# Patient Record
Sex: Female | Born: 1995 | Race: White | Hispanic: No | Marital: Single | Smoking: Former smoker
Health system: Southern US, Community
[De-identification: ages and names within clinical notes are randomized; demographics above are authoritative.]

## PROBLEM LIST (undated history)

## (undated) DIAGNOSIS — N926 Irregular menstruation, unspecified: Secondary | ICD-10-CM

## (undated) DIAGNOSIS — O039 Complete or unspecified spontaneous abortion without complication: Secondary | ICD-10-CM

## (undated) DIAGNOSIS — R519 Headache, unspecified: Secondary | ICD-10-CM

## (undated) DIAGNOSIS — Z8619 Personal history of other infectious and parasitic diseases: Secondary | ICD-10-CM

## (undated) DIAGNOSIS — R51 Headache: Secondary | ICD-10-CM

## (undated) HISTORY — DX: Personal history of other infectious and parasitic diseases: Z86.19

## (undated) HISTORY — DX: Irregular menstruation, unspecified: N92.6

## (undated) HISTORY — DX: Headache: R51

## (undated) HISTORY — PX: WISDOM TOOTH EXTRACTION: SHX21

## (undated) HISTORY — DX: Headache, unspecified: R51.9

## (undated) HISTORY — DX: Complete or unspecified spontaneous abortion without complication: O03.9

---
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
## 2014-07-31 DIAGNOSIS — R109 Unspecified abdominal pain: Secondary | ICD-10-CM | POA: Insufficient documentation

## 2014-07-31 DIAGNOSIS — K529 Noninfective gastroenteritis and colitis, unspecified: Secondary | ICD-10-CM | POA: Insufficient documentation

## 2014-07-31 DIAGNOSIS — R630 Anorexia: Secondary | ICD-10-CM | POA: Insufficient documentation

## 2014-08-01 DIAGNOSIS — N921 Excessive and frequent menstruation with irregular cycle: Secondary | ICD-10-CM | POA: Insufficient documentation

## 2014-09-14 IMAGING — US US OB < 14 WEEKS - US OB TV
1 series · 13 of 28 positions shown · non-contrast
Comparison: none

REASON FOR EXAM: threatened abortion; bleeding mild pelvic discomfort
COMMENTS:

[Series 1: us ob < 14 weeks - us ob tv · 0.20mm/px · 13 of 105 slices shown]
[im 4/105]
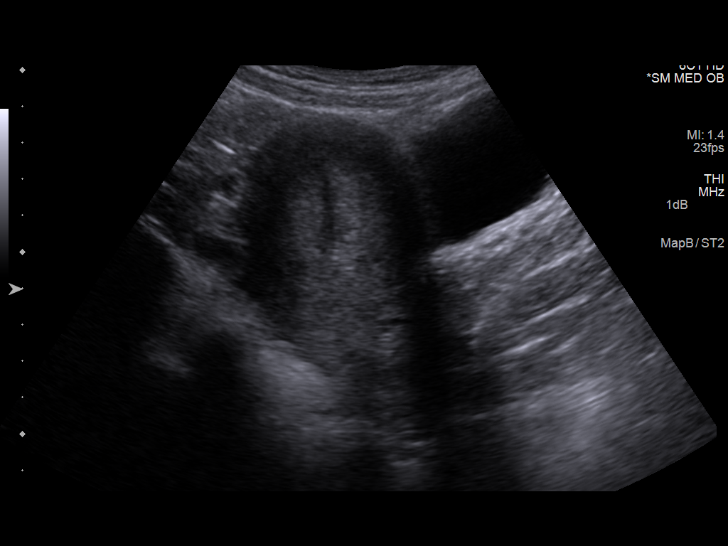
[im 12/105]
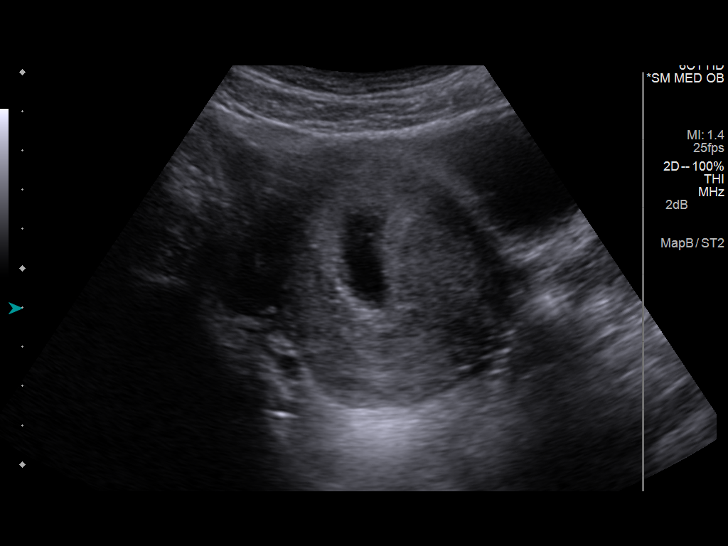
[im 20/105]
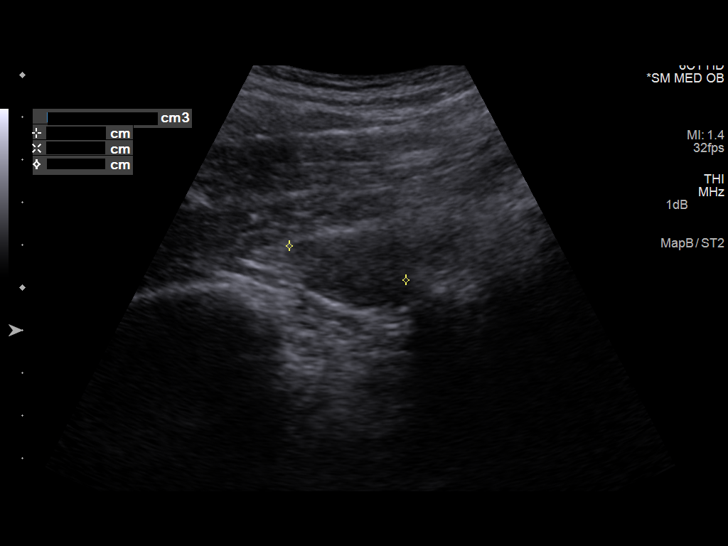
[im 27/105]
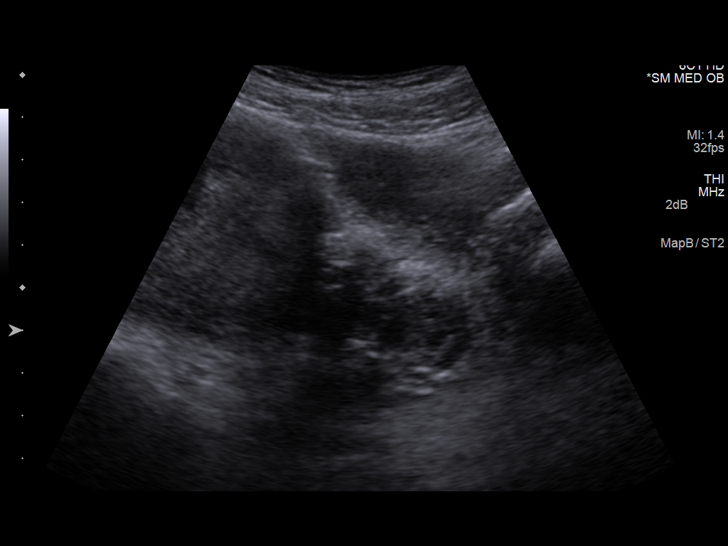
[im 35/105]
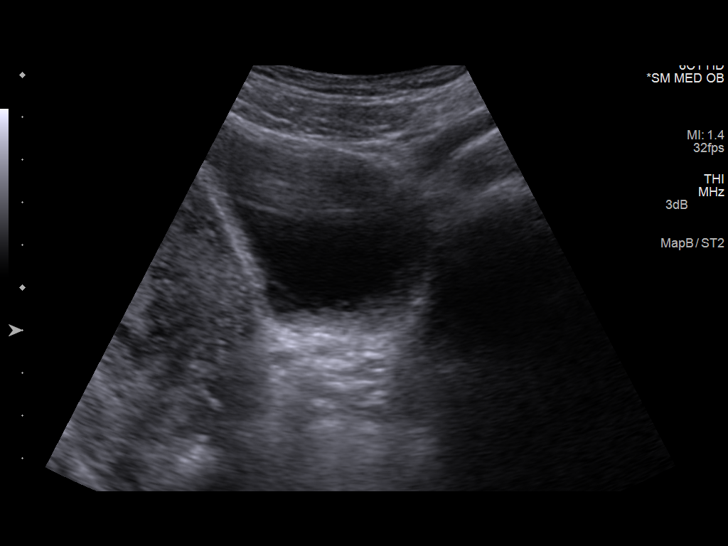
[im 43/105]
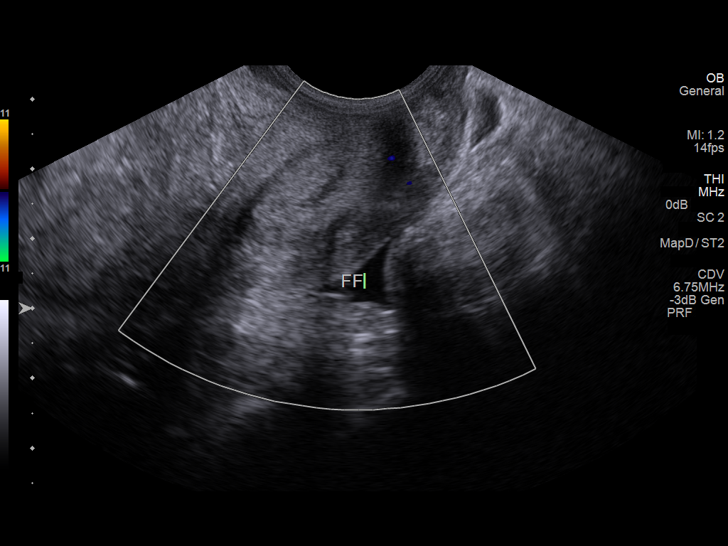
[im 54/105]
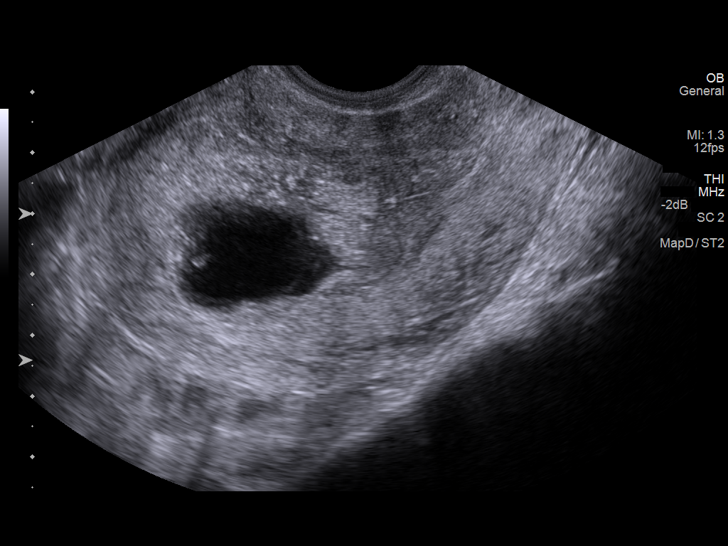
[im 62/105]
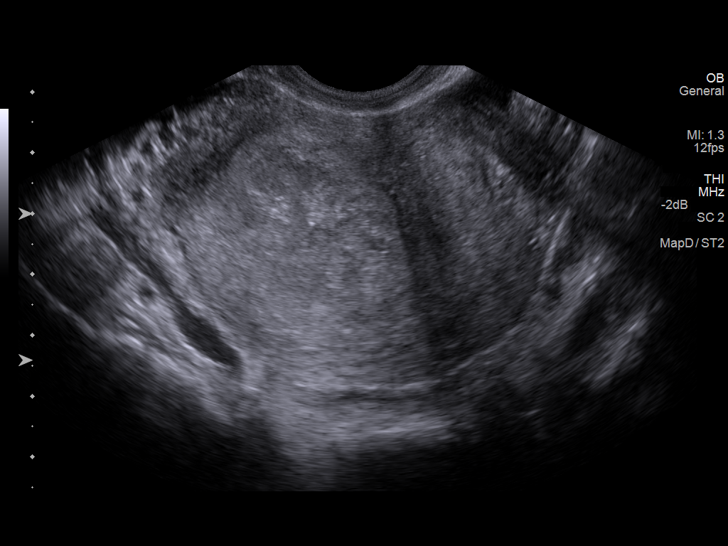
[im 70/105]
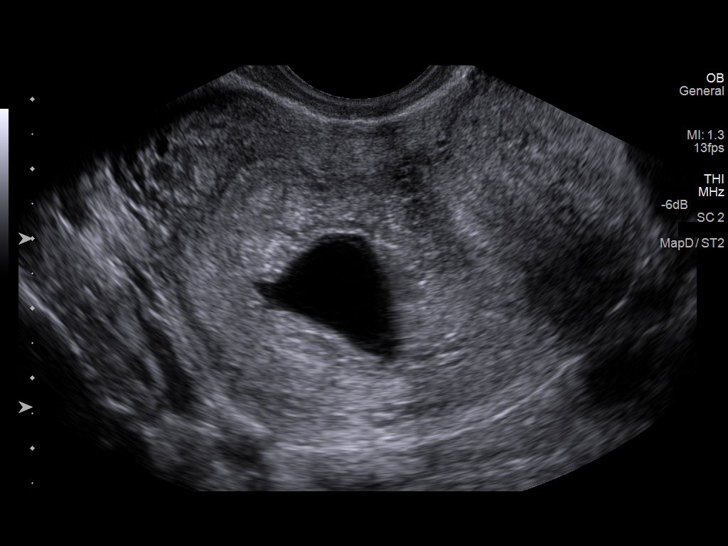
[im 78/105]
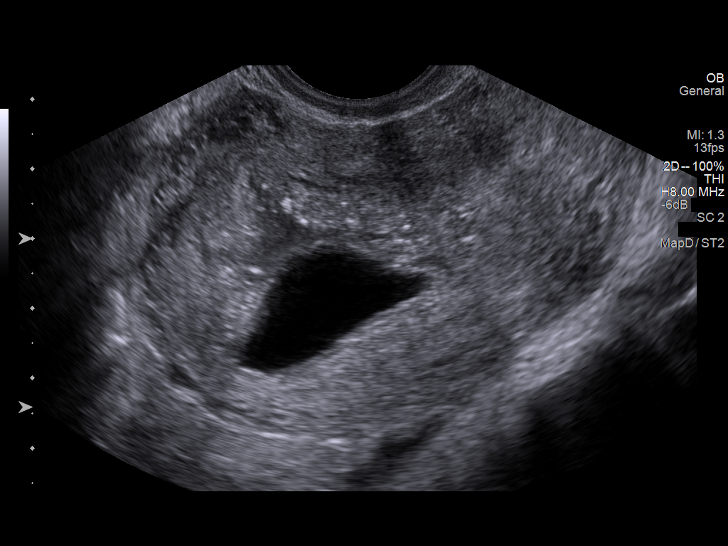
[im 85/105]
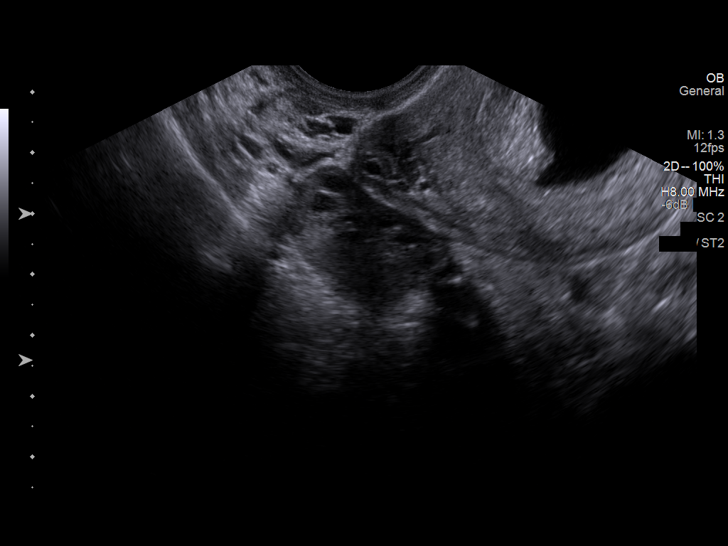
[im 93/105]
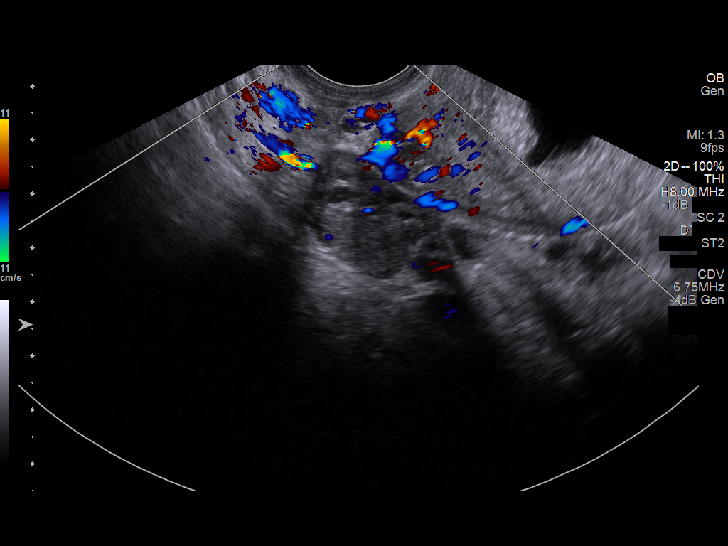
[im 101/105]
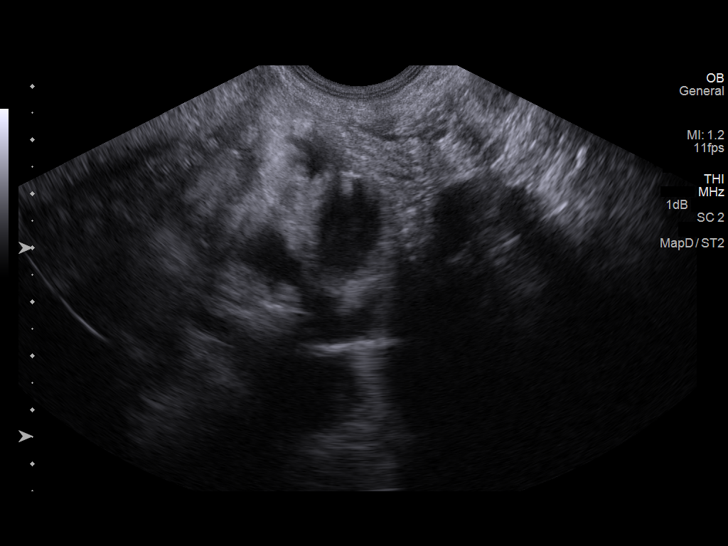

[13 of 28 positions shown; findings below may reference images not displayed]

PROCEDURE:     US  - US OB LESS THAN 14 WEEKS/W TRANS  - August 06, 2013  [DATE]

RESULT:     Transabdominal and transvaginal imaging was performed through
the pelvis.

There is a fluid-filled sac measuring 3.46 cm in the endometrial cavity.
This would correspond to an 8 week 5 day gestation. However, no yolk sac or
fetal pole is demonstrated. There are no findings suspicious for a
subchorionic hemorrhage. There is a small amount of fluid in the cervix.

The ovaries are normal in echotexture and contour. The right ovary measures
2.4 x 2.7 x 2.9 centimeters. The left ovary measures 2 x 1.3 x
centimeters.
IMPRESSION: 1. There is a fluid-filled sac in the endometrial cavity which could reflect
a gestational sac but there is no fetal pole or yolk sac demonstrated. If
this were to reflect a gestational sac it would correspond to an 8 week 5
day gestation. This may reflect a blighted ovum or an embryonic pregnancy or
pseudogestational sac.
2. There is no evidence of an ectopic pregnancy nor other adnexal
abnormality.

Continued followup of beta-hCG levels is recommended. Serial followup
ultrasound examinations are available upon reque[REDACTED]

## 2014-11-10 DIAGNOSIS — J312 Chronic pharyngitis: Secondary | ICD-10-CM | POA: Insufficient documentation

## 2014-12-06 ENCOUNTER — Emergency Department: Payer: Self-pay | Admitting: Emergency Medicine

## 2014-12-06 LAB — CBC WITH DIFFERENTIAL/PLATELET
BASOS PCT: 1.3 %
Basophil #: 0.1 10*3/uL (ref 0.0–0.1)
EOS PCT: 4.5 %
Eosinophil #: 0.3 10*3/uL (ref 0.0–0.7)
HCT: 43 % (ref 35.0–47.0)
HGB: 14.1 g/dL (ref 12.0–16.0)
Lymphocyte #: 2.2 10*3/uL (ref 1.0–3.6)
Lymphocyte %: 32.6 %
MCH: 31 pg (ref 26.0–34.0)
MCHC: 32.8 g/dL (ref 32.0–36.0)
MCV: 95 fL (ref 80–100)
MONO ABS: 0.5 x10 3/mm (ref 0.2–0.9)
Monocyte %: 7.6 %
NEUTROS PCT: 54 %
Neutrophil #: 3.7 10*3/uL (ref 1.4–6.5)
Platelet: 234 10*3/uL (ref 150–440)
RBC: 4.54 10*6/uL (ref 3.80–5.20)
RDW: 13.2 % (ref 11.5–14.5)
WBC: 6.8 10*3/uL (ref 3.6–11.0)

## 2014-12-06 LAB — COMPREHENSIVE METABOLIC PANEL
ALK PHOS: 70 U/L (ref 46–116)
ANION GAP: 11 (ref 7–16)
Albumin: 4 g/dL (ref 3.4–5.0)
BILIRUBIN TOTAL: 0.3 mg/dL (ref 0.2–1.0)
BUN: 10 mg/dL (ref 7–18)
Calcium, Total: 8.4 mg/dL — ABNORMAL LOW (ref 8.5–10.1)
Chloride: 109 mmol/L — ABNORMAL HIGH (ref 98–107)
Co2: 23 mmol/L (ref 21–32)
Creatinine: 0.67 mg/dL (ref 0.60–1.30)
EGFR (African American): >60
Glucose: 90 mg/dL (ref 65–99)
OSMOLALITY: 284 (ref 275–301)
Potassium: 3.6 mmol/L (ref 3.5–5.1)
SGOT(AST): 20 U/L (ref 15–37)
SGPT (ALT): 19 U/L (ref 14–63)
SODIUM: 143 mmol/L (ref 136–145)
Total Protein: 7.4 g/dL (ref 6.4–8.2)

## 2014-12-06 LAB — ETHANOL: Ethanol: 246 mg/dL

## 2014-12-06 LAB — LIPASE, BLOOD: Lipase: 118 U/L (ref 73–393)

## 2014-12-06 LAB — TROPONIN I

## 2014-12-07 LAB — URINALYSIS, COMPLETE
BILIRUBIN, UR: NEGATIVE
BLOOD: NEGATIVE
Bacteria: NONE SEEN
Glucose,UR: NEGATIVE mg/dL (ref 0–75)
Ketone: NEGATIVE
Leukocyte Esterase: NEGATIVE
Nitrite: NEGATIVE
Ph: 7 (ref 4.5–8.0)
Protein: NEGATIVE
RBC,UR: <1 /HPF (ref 0–5)
Specific Gravity: 1.002 (ref 1.003–1.030)
WBC UR: NONE SEEN /HPF (ref 0–5)

## 2016-06-03 HISTORY — PX: HAND SURGERY: SHX662

## 2016-08-22 ENCOUNTER — Encounter: Payer: Self-pay | Admitting: Family Medicine

## 2016-08-22 ENCOUNTER — Ambulatory Visit (INDEPENDENT_AMBULATORY_CARE_PROVIDER_SITE_OTHER): Payer: 59 | Admitting: Family Medicine

## 2016-08-22 VITALS — BP 116/66 | HR 92 | Temp 98.4°F | Wt 117.5 lb

## 2016-08-22 DIAGNOSIS — G4700 Insomnia, unspecified: Secondary | ICD-10-CM

## 2016-08-22 DIAGNOSIS — J069 Acute upper respiratory infection, unspecified: Secondary | ICD-10-CM | POA: Diagnosis not present

## 2016-08-22 MED ORDER — AZITHROMYCIN 250 MG PO TABS: ORAL_TABLET | ORAL | 0 refills | Status: DC

## 2016-08-22 MED ORDER — BENZONATATE 100 MG PO CAPS: 100.0000 mg | ORAL_CAPSULE | Freq: Three times a day (TID) | ORAL | 0 refills | Status: DC | PRN

## 2016-08-22 NOTE — Progress Notes (Signed)
Subjective:    Patient ID: Joyce Montoya, female    DOB: 09-28-1996, 20 y.o.   MRN: [id number]  HPI This is a 20 yo female accompanied by her mother, who presents today with cough, aches and headaches. She reports symptoms for at least a month. Was seen 8 days ago and was prescribed prednisone taper and albuterol inhaler. Felt better the first couple of days then resumed cough (productive of green/yellow sputum) and runny nose. Last night felt SOB and had wheezing. Hasn't used inhaler recently. No meds for cough recently. Has tired mucinex dm x 1 and it made her feel worse. Throat feels swollen, not sore except in the morning. Doesn't think she has had a fever.   Mother reports that Joyce Montoya has bee complaining of fatigue and feeling unwell for last 6 months. She has not been sleeping well- wakes every 1-1.5 hours through the night. Not sure why she awakens, denies pain, nocturia. She does not drink caffeine daily. Naps occasionally. Has difficulty staying awake during the day. No problems with sleep in the past. She lives with her boyfriend and his 66 yo daughter. 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 She reports her relationship is, "ok, sometimes good, sometimes bad, mostly good." Not currently sexually active. Uses condoms for birth control. She lives in a cabin and has recently found mold in the bathroom, wonders if symptoms related to exposure. Has intermittent diarrhea and boyfriend has diarrhea all the time. She does not currently have diarrhea.   She is taking daily metronidazole daily for recurrent BV Per gyn.   No past medical history on file. No past surgical history on file.  No family history on file. Social History  Substance Use Topics  . Smoking status: Current Every Day Smoker    Packs/day: 0.25  . Smokeless tobacco: Never Used  . Alcohol use Yes     Comment: social      Review of Systems Per HPI    Objective:   Physical Exam    Constitutional: She is oriented to person, place, and time. She appears well-developed and well-nourished. No distress.  HENT:  Head: Normocephalic and atraumatic.  Right Ear: Tympanic membrane, external ear and ear canal normal.  Left Ear: Tympanic membrane, external ear and ear canal normal.  Nose: Nose normal.  Mouth/Throat: Uvula is midline. No oropharyngeal exudate, posterior oropharyngeal edema or posterior oropharyngeal erythema.  Post nasal drainage.   Eyes: Conjunctivae are normal.  Neurological: She is alert and oriented to person, place, and time.  Skin: Skin is warm. She is not diaphoretic.  Psychiatric: She has a normal mood and affect. Her behavior is normal. Judgment and thought content normal.  Vitals reviewed.    BP 116/66   Pulse 92   Temp 98.4 F (36.9 C) (Oral)   Wt 117 lb 8 oz (53.3 kg)   LMP 08/07/2016   SpO2 98%   Assessment & Plan:  1. Upper respiratory tract infection, unspecified type - given prolonged course and recurrence of symptoms while on prednisone, will treat with antibiotic - azithromycin (ZITHROMAX) 250 MG tablet; Take 2 tablets today then 1 a day until finished  Dispense: 6 tablet; Refill: 0 - benzonatate (TESSALON) 100 MG capsule; Take 1-2 capsules (100-200 mg total) by mouth 3 (three) times daily as needed for cough.  Dispense: 40 capsule; Refill: 0 - encouraged smoking cessation - rest, use inhaler every 2-4 hours as needed for cough and wheeze  2. Insomnia, unspecified  type - new onset without obvious cause, unsure if stress playing role - Provided written and verbal information regarding diagnosis and treatment. - encouraged her to try melatonin nightly for at least two nights, discussed good sleep hygiene  She has an upcoming appointment with Joyce Montoya to establish care and can further discuss symptoms.   Joyce Reamer, FNP-BC  [facility]  08/22/2016 11:53 AM

## 2016-08-22 NOTE — Patient Instructions (Signed)
For nasal congestion you can use Afrin nasal spray for 3 days max, Sudafed, saline nasal spray (generic is fine for all). For cough you can try Delsym. Drink enough fluids to make your urine light yellow. For fever/chill/muscle aches you can take over the counter acetaminophen or ibuprofen.  Please come back in if you are not better in 5-7 days or if you develop wheezing, shortness of breath or persistent vomiting.   Insomnia Insomnia is a sleep disorder that makes it difficult to fall asleep or to stay asleep. Insomnia can cause tiredness (fatigue), low energy, difficulty concentrating, mood swings, and poor performance at work or school.  There are three different ways to classify insomnia:  Difficulty falling asleep.  Difficulty staying asleep.  Waking up too early in the morning. Any type of insomnia can be long-term (chronic) or short-term (acute). Both are common. Short-term insomnia usually lasts for three months or less. Chronic insomnia occurs at least three times a week for longer than three months. CAUSES  Insomnia may be caused by another condition, situation, or substance, such as:  Anxiety.  Certain medicines.  Gastroesophageal reflux disease (GERD) or other gastrointestinal conditions.  Asthma or other breathing conditions.  Restless legs syndrome, sleep apnea, or other sleep disorders.  Chronic pain.  Menopause. This may include hot flashes.  Stroke.  Abuse of alcohol, tobacco, or illegal drugs.  Depression.  Caffeine.   Neurological disorders, such as Alzheimer disease.  An overactive thyroid (hyperthyroidism). The cause of insomnia may not be known. RISK FACTORS Risk factors for insomnia include:  Gender. Women are more commonly affected than men.  Age. Insomnia is more common as you get older.  Stress. This may involve your professional or personal life.  Income. Insomnia is more common in people with lower income.  Lack of exercise.    Irregular work schedule or night shifts.  Traveling between different time zones. SIGNS AND SYMPTOMS If you have insomnia, trouble falling asleep or trouble staying asleep is the main symptom. This may lead to other symptoms, such as:  Feeling fatigued.  Feeling nervous about going to sleep.  Not feeling rested in the morning.  Having trouble concentrating.  Feeling irritable, anxious, or depressed. TREATMENT  Treatment for insomnia depends on the cause. If your insomnia is caused by an underlying condition, treatment will focus on addressing the condition. Treatment may also include:   Medicines to help you sleep.  Counseling or therapy.  Lifestyle adjustments. HOME CARE INSTRUCTIONS   Take medicines only as directed by your health care provider.  Keep regular sleeping and waking hours. Avoid naps.  Keep a sleep diary to help you and your health care provider figure out what could be causing your insomnia. Include:   When you sleep.  When you wake up during the night.  How well you sleep.   How rested you feel the next day.  Any side effects of medicines you are taking.  What you eat and drink.   Make your bedroom a comfortable place where it is easy to fall asleep:  Put up shades or special blackout curtains to block light from outside.  Use a white noise machine to block noise.  Keep the temperature cool.   Exercise regularly as directed by your health care provider. Avoid exercising right before bedtime.  Use relaxation techniques to manage stress. Ask your health care provider to suggest some techniques that may work well for you. These may include:  Breathing exercises.  Routines to  release muscle tension.  Visualizing peaceful scenes.  Cut back on alcohol, caffeinated beverages, and cigarettes, especially close to bedtime. These can disrupt your sleep.  Do not overeat or eat spicy foods right before bedtime. This can lead to digestive  discomfort that can make it hard for you to sleep.  Limit screen use before bedtime. This includes:  Watching TV.  Using your smartphone, tablet, and computer.  Stick to a routine. This can help you fall asleep faster. Try to do a quiet activity, brush your teeth, and go to bed at the same time each night.  Get out of bed if you are still awake after 15 minutes of trying to sleep. Keep the lights down, but try reading or doing a quiet activity. When you feel sleepy, go back to bed.  Make sure that you drive carefully. Avoid driving if you feel very sleepy.  Keep all follow-up appointments as directed by your health care provider. This is important. SEEK MEDICAL CARE IF:   You are tired throughout the day or have trouble in your daily routine due to sleepiness.  You continue to have sleep problems or your sleep problems get worse. SEEK IMMEDIATE MEDICAL CARE IF:   You have serious thoughts about hurting yourself or someone else.   This information is not intended to replace advice given to you by your health care provider. Make sure you discuss any questions you have with your health care provider.   Document Released: 10/17/2000 Document Revised: 07/11/2015 Document Reviewed: 07/21/2014 Elsevier Interactive Patient Education [employer].

## 2016-09-02 ENCOUNTER — Encounter: Payer: Self-pay | Admitting: Primary Care

## 2016-09-02 ENCOUNTER — Ambulatory Visit (INDEPENDENT_AMBULATORY_CARE_PROVIDER_SITE_OTHER): Payer: 59 | Admitting: Primary Care

## 2016-09-02 VITALS — BP 104/60 | HR 87 | Temp 98.3°F | Ht 61.0 in | Wt 115.4 lb

## 2016-09-02 DIAGNOSIS — R519 Headache, unspecified: Secondary | ICD-10-CM

## 2016-09-02 DIAGNOSIS — R51 Headache: Secondary | ICD-10-CM | POA: Diagnosis not present

## 2016-09-02 DIAGNOSIS — R5383 Other fatigue: Secondary | ICD-10-CM | POA: Diagnosis not present

## 2016-09-02 DIAGNOSIS — J302 Other seasonal allergic rhinitis: Secondary | ICD-10-CM | POA: Diagnosis not present

## 2016-09-02 LAB — COMPREHENSIVE METABOLIC PANEL
ALT: 19 U/L (ref 0–35)
AST: 21 U/L (ref 0–37)
Albumin: 4.1 g/dL (ref 3.5–5.2)
Alkaline Phosphatase: 44 U/L (ref 39–117)
BILIRUBIN TOTAL: 0.8 mg/dL (ref 0.2–1.2)
BUN: 14 mg/dL (ref 6–23)
CHLORIDE: 104 meq/L (ref 96–112)
CO2: 26 mEq/L (ref 19–32)
CREATININE: 0.65 mg/dL (ref 0.40–1.20)
Calcium: 9.3 mg/dL (ref 8.4–10.5)
GFR: 121.35 mL/min (ref >60.00)
Glucose, Bld: 82 mg/dL (ref 70–99)
Potassium: 4 mEq/L (ref 3.5–5.1)
SODIUM: 137 meq/L (ref 135–145)
TOTAL PROTEIN: 6.4 g/dL (ref 6.0–8.3)

## 2016-09-02 LAB — IBC PANEL
Iron: 210 ug/dL — ABNORMAL HIGH (ref 42–145)
SATURATION RATIOS: 74.3 % — AB (ref 20.0–50.0)
Transferrin: 202 mg/dL — ABNORMAL LOW (ref 212.0–360.0)

## 2016-09-02 LAB — CBC
HCT: 40.3 % (ref 36.0–46.0)
HEMOGLOBIN: 13.8 g/dL (ref 12.0–15.0)
MCHC: 34.2 g/dL (ref 30.0–36.0)
MCV: 95.3 fl (ref 78.0–100.0)
PLATELETS: 204 10*3/uL (ref 150.0–400.0)
RBC: 4.23 Mil/uL (ref 3.87–5.11)
RDW: 12.4 % (ref 11.5–15.5)
WBC: 5.1 10*3/uL (ref 4.0–10.5)

## 2016-09-02 LAB — TSH: TSH: 1.05 u[IU]/mL (ref 0.35–4.50)

## 2016-09-02 NOTE — Assessment & Plan Note (Signed)
Chronic, infrequent Claritin use.  Suspect recent symptoms of shortness of breath related to allergy to poor living conditions. Suspect this will improve with new living quarters. Recommend daily Claritin use, continue albuterol PRN. Will consider Spirometry next visit if symptoms persist. Exam unremarkable.

## 2016-09-02 NOTE — Progress Notes (Signed)
Subjective:    Patient ID: Joyce Montoya, female    DOB: [birth year], 20 y.o.   MRN: [id number]  HPI  Joyce Montoya is a 20 year old female who presents today to establish care and discuss the problems mentioned below. Will obtain old records.  1) Shortness of Breath: Also with wheezing and chest tightness for the past several years. She will also experience symptoms of sneezing, itchy/watery eyes. Her symptoms typically occur during the evening 2-3 times weekly. She has been using her boyfriends albuterol inhaler intermittently for the past 3 months with immediate improvement. She has no prior diagnosis of asthma but has suffered from seasonal allergies for most of her life.   Her symptoms of shortness of breath do not bother her during the day. She lives in a non insulated cabin that is very dusty and recently found mold growing in the bathroom. She moved out of her cabin 2 weeks ago. She does take Claritin as needed, but not on a regular basis. She has a strong family history of allergic rhinitis.  2) Fatigue: Feeling tired during the day, everyday even after a full nights sleep. She has struggled with this for the last 6 months. She will wake during the night to use the bathroom or get something to drink and is able to fall back asleep in 5-10 minutes. She does not have difficulty falling asleep. She denies swelling to her throat, cold intolerance, palpitations.  3) Headaches: Wakes up with headaches 3 times weekly on average. Her headaches are located to her frontal lobes bilaterally. Her headaches will sometimes last all day, sometimes improve by mid afternoon. She will occasionally take Advil or Tylenol, sometimes nothing. She will experience photophobia, phonophobia during headaches. She describes her pain as pressure, throbbing. Both her mother and sister have frequent headaches. She's never been on preventative treatment for her headaches.  Review of Systems  Constitutional: Positive for  fatigue. Negative for diaphoresis and fever.  HENT: Negative for congestion.   Respiratory: Positive for shortness of breath and wheezing. Negative for cough.   Cardiovascular: Negative for palpitations.  Endocrine: Negative for cold intolerance.  Neurological: Positive for headaches.  Psychiatric/Behavioral: Negative for sleep disturbance. The patient is not nervous/anxious.        Past Medical History:  Diagnosis Date  . Frequent headaches      Social History   Social History  . Marital status: Single    Spouse name: N/A  . Number of children: N/A  . Years of education: N/A   Occupational History  . Not on file.   Social History Main Topics  . Smoking status: Current Every Day Smoker    Packs/day: 0.25  . Smokeless tobacco: Never Used  . Alcohol use Yes     Comment: social  . Drug use: Unknown  . Sexual activity: Not on file   Other Topics Concern  . Not on file   Social History Narrative   Single.   Works for [employer].   Enjoys riding her horses.     Past Surgical History:  Procedure Laterality Date  . HAND SURGERY Right 06/2016    Family History  Problem Relation Age of Onset  . Headache Mother   . Ovarian cancer Mother   . Headache Sister     No Known Allergies  No current outpatient prescriptions on file prior to visit.   No current facility-administered medications on file prior to visit.     BP 104/60  Pulse 87   Temp 98.3 F (36.8 C) (Oral)   Ht 5\' 1"  (1.549 m)   Wt 115 lb 6.4 oz (52.3 kg)   LMP 08/07/2016   SpO2 97%   BMI 21.80 kg/m    Objective:   Physical Exam  Constitutional: She appears well-nourished.  Neck: Neck supple. No thyromegaly present.  Cardiovascular: Normal rate and regular rhythm.   Pulmonary/Chest: Effort normal and breath sounds normal.  Skin: Skin is warm and dry.  Psychiatric: She has a normal mood and affect.          Assessment & Plan:

## 2016-09-02 NOTE — Progress Notes (Signed)
Pre visit review using our clinic review tool, if applicable. No additional management support is needed unless otherwise documented below in the visit note. 

## 2016-09-02 NOTE — Assessment & Plan Note (Signed)
Occur three times weekly some weeks, some weeks without headaches. Discussed ibuprofen use and to notify if headaches become more frequent. Exam unremarkable.

## 2016-09-02 NOTE — Assessment & Plan Note (Signed)
Present x 6 months. Exam today unremarkable. Doesn't seem to be related to sleep. Could be stress given that she works often. Will check TSH, CBC, CMP to rule out any metabolic cause.

## 2016-09-02 NOTE — Patient Instructions (Signed)
Start taking your Claritin everyday for your allergy symptoms.  Use the Albuterol inhaler sparingly as needed for shortness of breath and chest tightness.  Complete lab work prior to leaving today. I will notify you of your results once received.   You may take Ibuprofen 600 mg three times daily as needed for headaches. Please notify me if you start to require this more than 4 times weekly on a consistent basis.  It was a pleasure to meet you today! Please don't hesitate to call me with any questions. Welcome to [facility]!

## 2016-10-22 DIAGNOSIS — Z1331 Encounter for screening for depression: Secondary | ICD-10-CM | POA: Insufficient documentation

## 2016-10-22 LAB — SICKLE CELL SCREEN: SICKLE CELL SCREEN: NORMAL

## 2016-10-22 LAB — OB RESULTS CONSOLE PLATELET COUNT: PLATELETS: 208 10*3/uL

## 2016-10-22 LAB — OB RESULTS CONSOLE RUBELLA ANTIBODY, IGM: RUBELLA: IMMUNE

## 2016-10-22 LAB — OB RESULTS CONSOLE GC/CHLAMYDIA
Chlamydia: NEGATIVE
Gonorrhea: NEGATIVE

## 2016-10-22 LAB — OB RESULTS CONSOLE RPR: RPR: NONREACTIVE

## 2016-10-22 LAB — OB RESULTS CONSOLE ANTIBODY SCREEN: ANTIBODY SCREEN: NEGATIVE

## 2016-10-22 LAB — OB RESULTS CONSOLE HEPATITIS B SURFACE ANTIGEN: HEP B S AG: NEGATIVE

## 2016-10-22 LAB — OB RESULTS CONSOLE ABO/RH: RH Type: POSITIVE

## 2016-10-22 LAB — OB RESULTS CONSOLE VARICELLA ZOSTER ANTIBODY, IGG: VARICELLA IGG: IMMUNE

## 2016-10-22 LAB — OB RESULTS CONSOLE HGB/HCT, BLOOD
HCT: 34 %
Hemoglobin: 12.1 g/dL

## 2016-10-22 LAB — HM PAP SMEAR

## 2016-10-22 LAB — OB RESULTS CONSOLE HIV ANTIBODY (ROUTINE TESTING): HIV: NONREACTIVE

## 2016-11-03 DIAGNOSIS — O039 Complete or unspecified spontaneous abortion without complication: Secondary | ICD-10-CM

## 2016-11-03 HISTORY — DX: Complete or unspecified spontaneous abortion without complication: O03.9

## 2017-01-29 DIAGNOSIS — Z1331 Encounter for screening for depression: Secondary | ICD-10-CM

## 2017-02-03 ENCOUNTER — Ambulatory Visit: Payer: Self-pay | Admitting: Advanced Practice Midwife

## 2017-05-19 ENCOUNTER — Ambulatory Visit (INDEPENDENT_AMBULATORY_CARE_PROVIDER_SITE_OTHER): Payer: 59 | Admitting: Obstetrics and Gynecology

## 2017-05-19 ENCOUNTER — Encounter: Payer: Self-pay | Admitting: Obstetrics and Gynecology

## 2017-05-19 VITALS — BP 102/68 | HR 85 | Ht 61.0 in | Wt 114.0 lb

## 2017-05-19 DIAGNOSIS — Z30011 Encounter for initial prescription of contraceptive pills: Secondary | ICD-10-CM | POA: Diagnosis not present

## 2017-05-19 DIAGNOSIS — N946 Dysmenorrhea, unspecified: Secondary | ICD-10-CM | POA: Diagnosis not present

## 2017-05-19 MED ORDER — NORETHIN ACE-ETH ESTRAD-FE 1-20 MG-MCG PO TABS: 1.0000 | ORAL_TABLET | Freq: Every day | ORAL | 1 refills | Status: DC

## 2017-05-19 NOTE — Progress Notes (Signed)
Chief Complaint  Patient presents with  . Contraception    discuss options    HPI:      [name] is a 21 y.o. G1P0010 who LMP was Patient's last menstrual period was 04/27/2016 (approximate)., presents today for [facility] consult. She would like to restart OCPs. She has done nexplanon in the past but had bleeding for 5 months. Menses are monthly off BC, lasting 7 days, with severe dysmen. NSAIDs not helpful, activities sometimes missed. No relief of dysmen with OCPs in the past but did have improvement of pain with nexplanon. She had constant bleeding with nexplanon however, so had it removed.  No hx of migraines with aura.   She is sex active, not using any BC/condoms. She is past due for her annual.      Patient Active Problem List   Diagnosis Date Noted  . Depression screening 10/22/2016  . Other fatigue 09/02/2016  . Frequent headaches 09/02/2016  . Seasonal allergies 09/02/2016  . Chronic sore throat 11/10/2014  . Menorrhagia with irregular cycle 08/01/2014  . Abdominal pain in female patient 07/31/2014  . Chronic diarrhea 07/31/2014  . Loss of appetite for more than 2 weeks 07/31/2014    Family History  Problem Relation Age of Onset  . Headache Mother   . Ovarian cancer Mother   . Headache Sister     Social History   Social History  . Marital status: Single    Spouse name: N/A  . Number of children: N/A  . Years of education: N/A   Occupational History  . Not on file.   Social History Main Topics  . Smoking status: Former Smoker    Packs/day: 0.25  . Smokeless tobacco: Never Used  . Alcohol use Yes     Comment: social  . Drug use: No  . Sexual activity: Yes    Partners: Male    Birth control/ protection: None   Other Topics Concern  . Not on file   Social History Narrative   Single.   Works for [employer].   Enjoys riding her horses.      Current Outpatient Prescriptions:  .  norethindrone-ethinyl estradiol ([name] 1/20)  1-20 MG-MCG tablet, Take 1 tablet by mouth daily., Disp: 28 tablet, Rfl: 1  Review of Systems  Constitutional: Negative for fever.  Gastrointestinal: Negative for blood in stool, constipation, diarrhea, nausea and vomiting.  Genitourinary: Negative for dyspareunia, dysuria, flank pain, frequency, hematuria, urgency, vaginal bleeding, vaginal discharge and vaginal pain.  Musculoskeletal: Negative for back pain.  Skin: Negative for rash.     OBJECTIVE:   Vitals:  BP 102/68 (BP Location: Left Arm, Patient Position: Sitting, Cuff Size: Normal)   Pulse 85   Ht 5\' 1"  (1.549 m)   Wt 114 lb (51.7 kg)   LMP 04/27/2016 (Approximate)   Breastfeeding? No   BMI 21.54 kg/m   Physical Exam  Constitutional: She is oriented to person, place, and time and well-developed, well-nourished, and in no distress.  Neurological: She is alert and oriented to person, place, and time.  Psychiatric: Memory, affect and judgment normal.  Vitals reviewed.    Assessment/Plan: Encounter for initial prescription of contraceptive pills - OCP start with next menses. Rx junel. Condoms. RTO in 2 months for annual.  Dysmenorrhea - Pt with severe sx on and off OCPs. Discussed depo if sx persist with OCP. Also discussed endometriosis and dx lap.     Meds ordered this encounter  Medications  .  norethindrone-ethinyl estradiol ([name] 1/20) 1-20 MG-MCG tablet    Sig: Take 1 tablet by mouth daily.    Dispense:  28 tablet    Refill:  1      Return in about 2 months (around 07/01/5620) for annual.  Alicia B. Copland, PA-C 05/19/2017 3:19 PM

## 2017-07-18 ENCOUNTER — Other Ambulatory Visit: Payer: Self-pay | Admitting: Obstetrics and Gynecology

## 2017-07-27 ENCOUNTER — Telehealth: Payer: Self-pay

## 2017-07-27 NOTE — Telephone Encounter (Signed)
Pt isn't due for annual until 10/22/17. The schedule isnt out yet

## 2017-07-27 NOTE — Telephone Encounter (Signed)
Pt calling for bc refill as she is out.  Pharm told her to contact us.  [phone]

## 2017-07-28 MED ORDER — NORETHIN ACE-ETH ESTRAD-FE 1-20 MG-MCG PO TABS: 1.0000 | ORAL_TABLET | Freq: Every day | ORAL | 2 refills | Status: DC

## 2017-07-28 NOTE — Telephone Encounter (Signed)
Pt aware by vm refills eRx'd.

## 2017-10-14 ENCOUNTER — Ambulatory Visit: Payer: 59 | Admitting: Family Medicine

## 2017-10-15 ENCOUNTER — Other Ambulatory Visit: Payer: Self-pay | Admitting: Obstetrics and Gynecology

## 2017-10-28 ENCOUNTER — Telehealth: Payer: Self-pay

## 2017-10-28 MED ORDER — NORETHIN ACE-ETH ESTRAD-FE 1-20 MG-MCG PO TABS: 1.0000 | ORAL_TABLET | Freq: Every day | ORAL | 0 refills | Status: DC

## 2017-10-28 NOTE — Telephone Encounter (Signed)
Please call pt and let her know refill eRx'd for one month.  She needs to sched annual exam.

## 2017-11-16 ENCOUNTER — Other Ambulatory Visit: Payer: Self-pay | Admitting: Obstetrics and Gynecology

## 2017-11-16 ENCOUNTER — Telehealth: Payer: Self-pay | Admitting: Obstetrics and Gynecology

## 2017-11-16 MED ORDER — NORETHIN ACE-ETH ESTRAD-FE 1-20 MG-MCG PO TABS: 1.0000 | ORAL_TABLET | Freq: Every day | ORAL | 0 refills | Status: DC

## 2017-11-16 NOTE — Telephone Encounter (Signed)
done

## 2017-11-16 NOTE — Telephone Encounter (Signed)
Pt is schedule January 12/03/16 at 3:00. Pt is requesting refill on Birthcontrol

## 2017-11-16 NOTE — Progress Notes (Signed)
Rx RF till annual 

## 2017-12-03 ENCOUNTER — Ambulatory Visit: Payer: 59 | Admitting: Obstetrics and Gynecology

## 2017-12-08 ENCOUNTER — Other Ambulatory Visit: Payer: Self-pay | Admitting: Obstetrics and Gynecology

## 2017-12-15 ENCOUNTER — Ambulatory Visit: Payer: 59 | Admitting: Obstetrics and Gynecology

## 2018-01-03 ENCOUNTER — Other Ambulatory Visit: Payer: Self-pay | Admitting: Obstetrics and Gynecology

## 2018-02-04 ENCOUNTER — Ambulatory Visit (INDEPENDENT_AMBULATORY_CARE_PROVIDER_SITE_OTHER): Payer: PRIVATE HEALTH INSURANCE | Admitting: Primary Care

## 2018-02-04 ENCOUNTER — Encounter: Payer: Self-pay | Admitting: Primary Care

## 2018-02-04 VITALS — BP 104/70 | HR 83 | Temp 98.3°F | Ht 61.0 in | Wt 116.8 lb

## 2018-02-04 DIAGNOSIS — L84 Corns and callosities: Secondary | ICD-10-CM

## 2018-02-04 DIAGNOSIS — D229 Melanocytic nevi, unspecified: Secondary | ICD-10-CM | POA: Diagnosis not present

## 2018-02-04 NOTE — Patient Instructions (Signed)
You will be contacted regarding your referral to Dermatology.  Please let us know if you have not been contacted within one week.   Wear sunscreen when exposed to the sun.  Try using toe socks or corn pads between your toes when wearing tight fitting shoes or when walking for prolonged periods of time.   It was a pleasure to see you today!   Corns and Calluses Corns are small areas of thickened skin that occur on the top, sides, or tip of a toe. They contain a cone-shaped core with a point that can press on a nerve below. This causes pain. Calluses are areas of thickened skin that can occur anywhere on the body including hands, fingers, palms, soles of the feet, and heels.Calluses are usually larger than corns. What are the causes? Corns and calluses are caused by rubbing (friction) or pressure, such as from shoes that are too tight or do not fit properly. What increases the risk? Corns are more likely to develop in people who have toe deformities, such as hammer toes. Since calluses can occur with friction to any area of the skin, calluses are more likely to develop in people who:  Work with their hands.  Wear shoes that fit poorly, shoes that are too tight, or shoes that are high-heeled.  Have toes deformities.  What are the signs or symptoms? Symptoms of a corn or callus include:  A hard growth on the skin.  Pain or tenderness under the skin.  Redness and swelling.  Increased discomfort while wearing tight-fitting shoes.  How is this diagnosed? Corns and calluses may be diagnosed with a medical history and physical exam. How is this treated? Corns and calluses may be treated with:  Removing the cause of the friction or pressure. This may include: ? Changing your shoes. ? Wearing shoe inserts (orthotics) or other protective layers in your shoes, such as a corn pad. ? Wearing gloves.  Medicines to help soften skin in the hardened, thickened areas.  Reducing the size of  the corn or callus by removing the dead layers of skin.  Antibiotic medicines to treat infection.  Surgery, if a toe deformity is the cause.  Follow these instructions at home:  Take medicines only as directed by your health care provider.  If you were prescribed an antibiotic, finish all of it even if you start to feel better.  Wear shoes that fit well. Avoid wearing high-heeled shoes and shoes that are too tight or too loose.  Wear any padding, protective layers, gloves, or orthotics as directed by your health care provider.  Soak your hands or feet and then use a file or pumice stone to soften your corn or callus. Do this as directed by your health care provider.  Check your corn or callus every day for signs of infection. Watch for: ? Redness, swelling, or pain. ? Fluid, blood, or pus. Contact a health care provider if:  Your symptoms do not improve with treatment.  You have increased redness, swelling, or pain at the site of your corn or callus.  You have fluid, blood, or pus coming from your corn or callus.  You have new symptoms. This information is not intended to replace advice given to you by your health care provider. Make sure you discuss any questions you have with your health care provider. Document Released: 07/26/2004 Document Revised: 05/09/2016 Document Reviewed: 10/16/2014 Elsevier Interactive Patient Education  [employer].

## 2018-02-04 NOTE — Progress Notes (Signed)
Subjective:    Patient ID: Joyce Montoya, female    DOB: 1996/08/29, 22 y.o.   MRN: [id number]  HPI  Joyce Montoya is a 22 year old female who presents today with multiple complaints.  1) Nevi: Located to the right lateral neck and right suprapubic region for years. She doesn't like them and would like them removed. She denies changes in size, shape, color.   2) Calluses: Located in between 4th and 5th toes on right foot, and between 4th and 5th toes on left foot. These have been intermittent for years, worse when wearing tight fitting shoes and when on her feet for prolonged periods of time.  3) Skin Spots: Located to the forehead that have been present for the past one month. 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  Review of Systems  Skin:       Nevi, skin spots, nevi       Past Medical History:  Diagnosis Date  . Frequent headaches   . History of chlamydia   . Irregular menses   . SAB (spontaneous abortion)   . SAB (spontaneous abortion) 11/2016     Social History   Socioeconomic History  . Marital status: Single    Spouse name: Not on file  . Number of children: Not on file  . Years of education: Not on file  . Highest education level: Not on file  Occupational History  . Not on file  Social Needs  . Financial resource strain: Not on file  . Food insecurity:    Worry: Not on file    Inability: Not on file  . Transportation needs:    Medical: Not on file    Non-medical: Not on file  Tobacco Use  . Smoking status: Former Smoker    Packs/day: 0.25  . Smokeless tobacco: Never Used  Substance and Sexual Activity  . Alcohol use: Yes    Comment: social  . Drug use: No  . Sexual activity: Yes    Partners: Male    Birth control/protection: None  Lifestyle  . Physical activity:    Days per week: Not on file    Minutes per session: Not on file  . Stress: Not on file  Relationships  . Social connections:    Talks  on phone: Not on file    Gets together: Not on file    Attends religious service: Not on file    Active member of club or organization: Not on file    Attends meetings of clubs or organizations: Not on file    Relationship status: Not on file  . Intimate partner violence:    Fear of current or ex partner: Not on file    Emotionally abused: Not on file    Physically abused: Not on file    Forced sexual activity: Not on file  Other Topics Concern  . Not on file  Social History Narrative   Single.   Works for [employer].   Enjoys riding her horses.     Past Surgical History:  Procedure Laterality Date  . HAND SURGERY Right 06/2016    Family History  Problem Relation Age of Onset  . Headache Mother   . Ovarian cancer Mother   . Headache Sister     No Known Allergies  Current Outpatient Medications on File Prior to Visit  Medication Sig Dispense Refill  . [name] 1/20 1-20 MG-MCG tablet TAKE 1  TABLET BY MOUTH EVERY DAY 28 tablet 4   No current facility-administered medications on file prior to visit.     BP 104/70   Pulse 83   Temp 98.3 F (36.8 C) (Oral)   Ht 5\' 1"  (1.549 m)   Wt 116 lb 12 oz (53 kg)   LMP 02/04/2018   SpO2 98%   BMI 22.06 kg/m    Objective:   Physical Exam  Skin: Skin is warm and dry. No erythema.     Callous to right foot, medial side of 5th toe and lateral side of 4th toe. Left foot with callous to medial side of 5th toe. Freckles to fore head. Nevus to right suprapubic region and right lateral neck.          Assessment & Plan:  Freckles:  Located to forehead from sun exposure. Mild sun damage. Discussed to always wear sunscreen when outdoors. Discussed treatment with medical spas vs dermatology evaluation.  Also discussed use of Vitamin E oil. Referral placed to dermatology.  Nevi:  Requesting removal of 2 nevi. Nevus located to suprapubic region very flat and would be difficult to remove in the office here. The  second nevus is located just over her carotid artery to right lateral neck, do not feel this is appropriate to remove in the office. Referral placed to dermatology for removal. Both Nevi appear benign.   Calloses:  Located to bilateral feet as mentioned above. Discussed use of corn pads/toe socks for protection against friction. Discussed to avoid tight fitting shoes, especially without socks. Also discussed podiatry evaluation if no improvement with conservative measures.   [name], NP

## 2018-05-13 ENCOUNTER — Other Ambulatory Visit: Payer: Self-pay

## 2018-05-13 ENCOUNTER — Encounter: Payer: Self-pay | Admitting: Emergency Medicine

## 2018-05-13 ENCOUNTER — Emergency Department
Admission: EM | Admit: 2018-05-13 | Discharge: 2018-05-13 | Disposition: A | Discharge status: Home / Self Care | Payer: No Typology Code available for payment source | Attending: Emergency Medicine | Admitting: Emergency Medicine

## 2018-05-13 DIAGNOSIS — T675XXA Heat exhaustion, unspecified, initial encounter: Secondary | ICD-10-CM | POA: Insufficient documentation

## 2018-05-13 DIAGNOSIS — R531 Weakness: Secondary | ICD-10-CM | POA: Insufficient documentation

## 2018-05-13 DIAGNOSIS — R200 Anesthesia of skin: Secondary | ICD-10-CM | POA: Insufficient documentation

## 2018-05-13 DIAGNOSIS — Z79899 Other long term (current) drug therapy: Secondary | ICD-10-CM | POA: Diagnosis not present

## 2018-05-13 DIAGNOSIS — E860 Dehydration: Secondary | ICD-10-CM | POA: Diagnosis not present

## 2018-05-13 DIAGNOSIS — Z87891 Personal history of nicotine dependence: Secondary | ICD-10-CM | POA: Insufficient documentation

## 2018-05-13 LAB — URINALYSIS, COMPLETE (UACMP) WITH MICROSCOPIC
BILIRUBIN URINE: NEGATIVE
Glucose, UA: NEGATIVE mg/dL
Hgb urine dipstick: NEGATIVE
Ketones, ur: NEGATIVE mg/dL
NITRITE: NEGATIVE
Protein, ur: NEGATIVE mg/dL
SPECIFIC GRAVITY, URINE: 1.012 (ref 1.005–1.030)
pH: 7 (ref 5.0–8.0)

## 2018-05-13 LAB — BASIC METABOLIC PANEL
ANION GAP: 8 (ref 5–15)
BUN: 11 mg/dL (ref 6–20)
CALCIUM: 9 mg/dL (ref 8.9–10.3)
CO2: 25 mmol/L (ref 22–32)
Chloride: 106 mmol/L (ref 98–111)
Creatinine, Ser: 0.58 mg/dL (ref 0.44–1.00)
GFR calc Af Amer: >60 mL/min (ref >60)
GLUCOSE: 133 mg/dL — AB (ref 70–99)
POTASSIUM: 3.9 mmol/L (ref 3.5–5.1)
SODIUM: 139 mmol/L (ref 135–145)

## 2018-05-13 LAB — HEPATIC FUNCTION PANEL
ALK PHOS: 51 U/L (ref 38–126)
ALT: 19 U/L (ref 0–44)
AST: 27 U/L (ref 15–41)
Albumin: 4.2 g/dL (ref 3.5–5.0)
BILIRUBIN TOTAL: 0.6 mg/dL (ref 0.3–1.2)
Bilirubin, Direct: <0.1 mg/dL (ref 0.0–0.2)
Total Protein: 7 g/dL (ref 6.5–8.1)

## 2018-05-13 LAB — CBC
HCT: 39.5 % (ref 35.0–47.0)
HEMOGLOBIN: 14.1 g/dL (ref 12.0–16.0)
MCH: 33.5 pg (ref 26.0–34.0)
MCHC: 35.7 g/dL (ref 32.0–36.0)
MCV: 93.7 fL (ref 80.0–100.0)
Platelets: 184 10*3/uL (ref 150–440)
RBC: 4.22 MIL/uL (ref 3.80–5.20)
RDW: 12.4 % (ref 11.5–14.5)
WBC: 3.8 10*3/uL (ref 3.6–11.0)

## 2018-05-13 LAB — CK: Total CK: 74 U/L (ref 38–234)

## 2018-05-13 LAB — POCT PREGNANCY, URINE: Preg Test, Ur: NEGATIVE

## 2018-05-13 LAB — HCG, QUANTITATIVE, PREGNANCY

## 2018-05-13 MED ORDER — SODIUM CHLORIDE 0.9 % IV BOLUS
1000.0000 mL | Freq: Once | INTRAVENOUS | Status: AC
  Administered 2018-05-13: 1000 mL via INTRAVENOUS

## 2018-05-13 MED ORDER — KETOROLAC TROMETHAMINE 30 MG/ML IJ SOLN
15.0000 mg | Freq: Once | INTRAMUSCULAR | Status: AC
  Administered 2018-05-13: 15 mg via INTRAVENOUS
  Filled 2018-05-13: qty 1

## 2018-05-13 MED ORDER — HALOPERIDOL LACTATE 5 MG/ML IJ SOLN
2.5000 mg | Freq: Once | INTRAMUSCULAR | Status: AC
  Administered 2018-05-13: 2.5 mg via INTRAVENOUS
  Filled 2018-05-13: qty 1

## 2018-05-13 NOTE — ED Triage Notes (Signed)
Pt presents to ED via POV with c/o generalized body aches that started at approx 1400 today. Pt states she started feeling "weak and funny" at approx 1200 today. Pt is noted to be tearful in triage. States intermittent sharp pains and intermittent tingling to her fingers of both hands.

## 2018-05-13 NOTE — Discharge Instructions (Signed)
It was a pleasure to take care of you today, and thank you for coming to our emergency department.  If you have any questions or concerns before leaving please ask the nurse to grab me and I'm more than happy to go through your aftercare instructions again.  If you were prescribed any opioid pain medication today such as Norco, Vicodin, Percocet, morphine, hydrocodone, or oxycodone please make sure you do not drive when you are taking this medication as it can alter your ability to drive safely.  If you have any concerns once you are home that you are not improving or are in fact getting worse before you can make it to your follow-up appointment, please do not hesitate to call 911 and come back for further evaluation.  Darel Hong, MD  Results for orders placed or performed during the hospital encounter of 27/51/70  Basic metabolic panel  Result Value Ref Range   Sodium 139 135 - 145 mmol/L   Potassium 3.9 3.5 - 5.1 mmol/L   Chloride 106 98 - 111 mmol/L   CO2 25 22 - 32 mmol/L   Glucose, Bld 133 (H) 70 - 99 mg/dL   BUN 11 6 - 20 mg/dL   Creatinine, Ser 0.58 0.44 - 1.00 mg/dL   Calcium 9.0 8.9 - 10.3 mg/dL   GFR calc non Af Amer >60 >60 mL/min   GFR calc Af Amer >60 >60 mL/min   Anion gap 8 5 - 15  CBC  Result Value Ref Range   WBC 3.8 3.6 - 11.0 K/uL   RBC 4.22 3.80 - 5.20 MIL/uL   Hemoglobin 14.1 12.0 - 16.0 g/dL   HCT 39.5 35.0 - 47.0 %   MCV 93.7 80.0 - 100.0 fL   MCH 33.5 26.0 - 34.0 pg   MCHC 35.7 32.0 - 36.0 g/dL   RDW 12.4 11.5 - 14.5 %   Platelets 184 150 - 440 K/uL  Urinalysis, Complete w Microscopic  Result Value Ref Range   Color, Urine YELLOW (A) YELLOW   APPearance CLEAR (A) CLEAR   Specific Gravity, Urine 1.012 1.005 - 1.030   pH 7.0 5.0 - 8.0   Glucose, UA NEGATIVE NEGATIVE mg/dL   Hgb urine dipstick NEGATIVE NEGATIVE   Bilirubin Urine NEGATIVE NEGATIVE   Ketones, ur NEGATIVE NEGATIVE mg/dL   Protein, ur NEGATIVE NEGATIVE mg/dL   Nitrite NEGATIVE NEGATIVE    Leukocytes, UA TRACE (A) NEGATIVE   RBC / HPF 0-5 0 - 5 RBC/hpf   WBC, UA 0-5 0 - 5 WBC/hpf   Bacteria, UA RARE (A) NONE SEEN   Squamous Epithelial / LPF 0-5 0 - 5  Hepatic function panel  Result Value Ref Range   Total Protein 7.0 6.5 - 8.1 g/dL   Albumin 4.2 3.5 - 5.0 g/dL   AST 27 15 - 41 U/L   ALT 19 0 - 44 U/L   Alkaline Phosphatase 51 38 - 126 U/L   Total Bilirubin 0.6 0.3 - 1.2 mg/dL   Bilirubin, Direct <0.1 0.0 - 0.2 mg/dL   Indirect Bilirubin NOT CALCULATED 0.3 - 0.9 mg/dL  hCG, quantitative, pregnancy  Result Value Ref Range   hCG, Beta Chain, Quant, S <1 <5 mIU/mL  CK  Result Value Ref Range   Total CK 74 38 - 234 U/L  Pregnancy, urine POC  Result Value Ref Range   Preg Test, Ur NEGATIVE NEGATIVE

## 2018-05-13 NOTE — ED Provider Notes (Signed)
[facility] Emergency Department Provider Note  ____________________________________________   First MD Initiated Contact with Patient 05/13/18 1857     (approximate)  I have reviewed the triage vital signs and the nursing notes.   HISTORY  Chief Complaint Weakness and Generalized Body Aches   HPI Joyce Montoya is a 22 y.o. female who comes to the emergency department with malaise generalized body aches and numbness and tingling in her fingers that began earlier today.  She was outside working in the hot sun when she began to hyperventilate and feel lightheaded and felt like she might pass out.  She did not pass out.  She had no chest pain or shortness of breath.  She has severe "tingly pain" in her entire body.  Nothing seems to make it better or worse.  This is never happened to her before.    Past Medical History:  Diagnosis Date  . Frequent headaches   . History of chlamydia   . Irregular menses   . SAB (spontaneous abortion)   . SAB (spontaneous abortion) 11/2016    Patient Active Problem List   Diagnosis Date Noted  . Depression screening 10/22/2016  . Other fatigue 09/02/2016  . Frequent headaches 09/02/2016  . Seasonal allergies 09/02/2016  . Chronic sore throat 11/10/2014  . Menorrhagia with irregular cycle 08/01/2014  . Abdominal pain in female patient 07/31/2014  . Chronic diarrhea 07/31/2014  . Loss of appetite for more than 2 weeks 07/31/2014    Past Surgical History:  Procedure Laterality Date  . HAND SURGERY Right 06/2016    Prior to Admission medications   Medication Sig Start Date End Date Taking? Authorizing Provider  [name] 1/20 1-20 MG-MCG tablet TAKE 1 TABLET BY MOUTH EVERY DAY 02/02/27  Yes Copland, Alicia B, PA-C    Allergies Patient has no known allergies.  Family History  Problem Relation Age of Onset  . Headache Mother   . Ovarian cancer Mother   . Headache Sister     Social History Social History    Tobacco Use  . Smoking status: Former Smoker    Packs/day: 0.25  . Smokeless tobacco: Never Used  Substance Use Topics  . Alcohol use: Yes    Comment: social  . Drug use: No    Review of Systems Constitutional: No fever/chills Eyes: No visual changes. ENT: No sore throat. Cardiovascular: Denies chest pain. Respiratory: Denies shortness of breath. Gastrointestinal: No abdominal pain.  Positive for nausea, no vomiting.  No diarrhea.  No constipation. Genitourinary: Negative for dysuria. Musculoskeletal: Negative for back pain. Skin: Negative for rash. Neurological: Negative for headaches, focal weakness or numbness.   ____________________________________________   PHYSICAL EXAM:  VITAL SIGNS: ED Triage Vitals [05/13/18 1844]  Enc Vitals Group     BP 115/70     Pulse Rate (!) 116     Resp 20     Temp 99.4 F (37.4 C)     Temp Source Oral     SpO2 99 %     Weight 120 lb (54.4 kg)     Height 5\' 1"  (1.549 m)     Head Circumference      Peak Flow      Pain Score 9     Pain Loc      Pain Edu?      Excl. in [place]?     Constitutional: Alert and oriented x4 tearful and anxious appearing Eyes: PERRL EOMI. Head: Atraumatic. Nose: No congestion/rhinnorhea. Mouth/Throat: No  trismus Neck: No stridor.   Cardiovascular: Tachycardic rate, regular rhythm. Grossly normal heart sounds.  Good peripheral circulation. Respiratory: Increased respiratory effort.  No retractions. Lungs CTAB and moving good air Gastrointestinal: Soft nontender Musculoskeletal: No lower extremity edema   Neurologic:  Normal speech and language. No gross focal neurologic deficits are appreciated. Skin:  Skin is warm, dry and intact. No rash noted. Psychiatric: Anxious appearing   ____________________________________________   DIFFERENTIAL includes but not limited to  Dehydration, heat exhaustion, rhabdomyolysis ____________________________________________   LABS (all labs ordered are listed,  but only abnormal results are displayed)  Labs Reviewed  BASIC METABOLIC PANEL - Abnormal; Notable for the following components:      Result Value   Glucose, Bld 133 (*)    All other components within normal limits  URINALYSIS, COMPLETE (UACMP) WITH MICROSCOPIC - Abnormal; Notable for the following components:   Color, Urine YELLOW (*)    APPearance CLEAR (*)    Leukocytes, UA TRACE (*)    Bacteria, UA RARE (*)    All other components within normal limits  CBC  HEPATIC FUNCTION PANEL  HCG, QUANTITATIVE, PREGNANCY  CK  POCT PREGNANCY, URINE    Lab work reviewed by me with no acute disease __________________________________________  EKG  ED ECG REPORT I, Darel Hong, the attending physician, personally viewed and interpreted this ECG.  Date: 05/15/2018 EKG Time:  Rate: 116 Rhythm: Tachycardia QRS Axis: Rightward axis which is old Intervals: normal ST/T Wave abnormalities: normal Narrative Interpretation: no evidence of acute ischemia  ____________________________________________  RADIOLOGY   ____________________________________________   PROCEDURES  Procedure(s) performed: no  Procedures  Critical Care performed: no  ____________________________________________   INITIAL IMPRESSION / ASSESSMENT AND PLAN / ED COURSE  Pertinent labs & imaging results that were available during my care of the patient were reviewed by me and considered in my medical decision making (see chart for details).   The patient arrives anxious and tachycardic.  She does have a rightward axis which is old.  Given IV haloperidol with improvement in her symptoms.  Her symptoms are consistent with hyperventilation a panic attack and heat exhaustion.  Strict return precautions have been given.  Clinical Course as of May 15 112  Thu May 13, 2018  2031 Bacteria, UA(!): RARE [SD]    Clinical Course User Index [SD] Kayleen Memos, Student-PA      ____________________________________________   FINAL CLINICAL IMPRESSION(S) / ED DIAGNOSES  Final diagnoses:  Weakness  Dehydration  Numbness  Heat exhaustion, initial encounter      NEW MEDICATIONS STARTED DURING THIS VISIT:  Discharge Medication List as of 05/13/2018  9:21 PM       Note:  This document was prepared using Dragon voice recognition software and may include unintentional dictation errors.     Darel Hong, MD 05/15/18 [phone]

## 2018-06-03 ENCOUNTER — Other Ambulatory Visit: Payer: Self-pay | Admitting: Obstetrics and Gynecology

## 2018-06-07 ENCOUNTER — Emergency Department
Admission: EM | Admit: 2018-06-07 | Discharge: 2018-06-07 | Disposition: A | Discharge status: Home / Self Care | Payer: No Typology Code available for payment source | Attending: Emergency Medicine | Admitting: Emergency Medicine

## 2018-06-07 ENCOUNTER — Emergency Department: Payer: No Typology Code available for payment source

## 2018-06-07 ENCOUNTER — Other Ambulatory Visit: Payer: Self-pay

## 2018-06-07 ENCOUNTER — Encounter: Payer: Self-pay | Admitting: Emergency Medicine

## 2018-06-07 DIAGNOSIS — R072 Precordial pain: Secondary | ICD-10-CM | POA: Diagnosis not present

## 2018-06-07 DIAGNOSIS — Z5321 Procedure and treatment not carried out due to patient leaving prior to being seen by health care provider: Secondary | ICD-10-CM | POA: Insufficient documentation

## 2018-06-07 DIAGNOSIS — R0602 Shortness of breath: Secondary | ICD-10-CM | POA: Diagnosis not present

## 2018-06-07 LAB — BASIC METABOLIC PANEL
Anion gap: 9 (ref 5–15)
BUN: 13 mg/dL (ref 6–20)
CALCIUM: 9.9 mg/dL (ref 8.9–10.3)
CO2: 25 mmol/L (ref 22–32)
Chloride: 104 mmol/L (ref 98–111)
Creatinine, Ser: 0.59 mg/dL (ref 0.44–1.00)
GFR calc Af Amer: >60 mL/min (ref >60)
GLUCOSE: 87 mg/dL (ref 70–99)
POTASSIUM: 3.5 mmol/L (ref 3.5–5.1)
SODIUM: 138 mmol/L (ref 135–145)

## 2018-06-07 LAB — POCT PREGNANCY, URINE: PREG TEST UR: POSITIVE — AB

## 2018-06-07 LAB — TROPONIN I

## 2018-06-07 LAB — CBC
HEMATOCRIT: 36.4 % (ref 35.0–47.0)
Hemoglobin: 12.8 g/dL (ref 12.0–16.0)
MCH: 33 pg (ref 26.0–34.0)
MCHC: 35.2 g/dL (ref 32.0–36.0)
MCV: 93.9 fL (ref 80.0–100.0)
Platelets: 182 10*3/uL (ref 150–440)
RBC: 3.88 MIL/uL (ref 3.80–5.20)
RDW: 12.6 % (ref 11.5–14.5)
WBC: 6.6 10*3/uL (ref 3.6–11.0)

## 2018-06-07 LAB — HCG, QUANTITATIVE, PREGNANCY: hCG, Beta Chain, Quant, S: 2437 m[IU]/mL — ABNORMAL HIGH (ref <5)

## 2018-06-07 NOTE — ED Triage Notes (Signed)
Pt presents to ED with midsternal chest pain with sob. Onset approx 30 min ago. Similar symptoms yesterday morning; pt reports it was not as severe previously. Denies radiating pain. Vomiting X3; small amounts. Pain initially started at top of her chest but has "moved down" since onset of her symptoms.Joyce Montoya

## 2018-06-08 ENCOUNTER — Telehealth: Payer: Self-pay | Admitting: Emergency Medicine

## 2018-06-08 NOTE — Telephone Encounter (Signed)
Called patient due to lwot to inquire about condition and follow up plans. Left message.  Told her to make sure she tells her doctor about lab work done here so they can review.

## 2018-07-15 ENCOUNTER — Encounter: Payer: Self-pay | Admitting: Primary Care

## 2018-07-15 ENCOUNTER — Ambulatory Visit (INDEPENDENT_AMBULATORY_CARE_PROVIDER_SITE_OTHER): Payer: PRIVATE HEALTH INSURANCE | Admitting: Primary Care

## 2018-07-15 ENCOUNTER — Telehealth: Payer: Self-pay | Admitting: Primary Care

## 2018-07-15 VITALS — BP 104/70 | HR 104 | Temp 98.2°F | Ht 61.0 in | Wt 114.0 lb

## 2018-07-15 DIAGNOSIS — R112 Nausea with vomiting, unspecified: Secondary | ICD-10-CM | POA: Diagnosis not present

## 2018-07-15 MED ORDER — PROMETHAZINE HCL 25 MG/ML IJ SOLN
12.5000 mg | Freq: Once | INTRAMUSCULAR | Status: AC
  Administered 2018-07-15: 12.5 mg via INTRAMUSCULAR

## 2018-07-15 MED ORDER — ONDANSETRON 4 MG PO TBDP: 4.0000 mg | ORAL_TABLET | Freq: Three times a day (TID) | ORAL | 0 refills | Status: DC | PRN

## 2018-07-15 NOTE — Addendum Note (Signed)
Addended by: Jacqualin Combes on: 07/15/2018 09:31 AM   Modules accepted: Orders

## 2018-07-15 NOTE — Patient Instructions (Signed)
You were provided with an injection of promethazine for nausea and sleep.  Pick up the prescription for ondansetron (Zofran) for nausea. You can take this every 8 hours as needed.  Make sure to stay hydrated with water and Gatorade.   Do no take anything for your diarrhea for now. Slowly advance your diet as tolerated.  Please notify us if no improvement in 24 hours.  It was a pleasure to see you today!   Food Choices to Help Relieve Diarrhea, Adult When you have diarrhea, the foods you eat and your eating habits are very important. Choosing the right foods and drinks can help:  Relieve diarrhea.  Replace lost fluids and nutrients.  Prevent dehydration.  What general guidelines should I follow? Relieving diarrhea  Choose foods with less than 2 g or .07 oz. of fiber per serving.  Limit fats to less than 8 tsp (38 g or 1.34 oz.) a day.  Avoid the following: ? Foods and beverages sweetened with high-fructose corn syrup, honey, or sugar alcohols such as xylitol, sorbitol, and mannitol. ? Foods that contain a lot of fat or sugar. ? Fried, greasy, or spicy foods. ? High-fiber grains, breads, and cereals. ? Raw fruits and vegetables.  Eat foods that are rich in probiotics. These foods include dairy products such as yogurt and fermented milk products. They help increase healthy bacteria in the stomach and intestines (gastrointestinal tract, or GI tract).  If you have lactose intolerance, avoid dairy products. These may make your diarrhea worse.  Take medicine to help stop diarrhea (antidiarrheal medicine) only as told by your health care provider. Replacing nutrients  Eat small meals or snacks every 3-4 hours.  Eat bland foods, such as white rice, toast, or baked potato, until your diarrhea starts to get better. Gradually reintroduce nutrient-rich foods as tolerated or as told by your health care provider. This includes: ? Well-cooked protein foods. ? Peeled, seeded, and  soft-cooked fruits and vegetables. ? Low-fat dairy products.  Take vitamin and mineral supplements as told by your health care provider. Preventing dehydration   Start by sipping water or a special solution to prevent dehydration (oral rehydration solution, ORS). Urine that is clear or pale yellow means that you are getting enough fluid.  Try to drink at least 8-10 cups of fluid each day to help replace lost fluids.  You may add other liquids in addition to water, such as clear juice or decaffeinated sports drinks, as tolerated or as told by your health care provider.  Avoid drinks with caffeine, such as coffee, tea, or soft drinks.  Avoid alcohol. What foods are recommended? The items listed may not be a complete list. Talk with your health care provider about what dietary choices are best for you. Grains White rice. White, [place], or pita breads (fresh or toasted), including plain rolls, buns, or bagels. White pasta. Saltine, soda, or graham crackers. Pretzels. Low-fiber cereal. Cooked cereals made with water (such as cornmeal, farina, or cream cereals). Plain muffins. Matzo. Melba toast. Zwieback. Vegetables Potatoes (without the skin). Most well-cooked and canned vegetables without skins or seeds. Tender lettuce. Fruits Apple sauce. Fruits canned in juice. Cooked apricots, cherries, grapefruit, peaches, pears, or plums. Fresh bananas and cantaloupe. Meats and other protein foods Baked or boiled chicken. Eggs. Tofu. Fish. Seafood. Smooth nut butters. Ground or well-cooked tender beef, ham, veal, lamb, pork, or poultry. Dairy Plain yogurt, kefir, and unsweetened liquid yogurt. Lactose-free milk, buttermilk, skim milk, or soy milk. Low-fat or nonfat hard  cheese. Beverages Water. Low-calorie sports drinks. Fruit juices without pulp. Strained tomato and vegetable juices. Decaffeinated teas. Sugar-free beverages not sweetened with sugar alcohols. Oral rehydration solutions, if approved by  your health care provider. Seasoning and other foods Bouillon, broth, or soups made from recommended foods. What foods are not recommended? The items listed may not be a complete list. Talk with your health care provider about what dietary choices are best for you. Grains Whole grain, whole wheat, bran, or rye breads, rolls, pastas, and crackers. Wild or brown rice. Whole grain or bran cereals. Barley. Oats and oatmeal. Corn tortillas or taco shells. Granola. Popcorn. Vegetables Raw vegetables. Fried vegetables. Cabbage, broccoli, Brussels sprouts, artichokes, baked beans, beet greens, corn, kale, legumes, peas, sweet potatoes, and yams. Potato skins. Cooked spinach and cabbage. Fruits Dried fruit, including raisins and dates. Raw fruits. Stewed or dried prunes. Canned fruits with syrup. Meat and other protein foods Fried or fatty meats. Deli meats. Chunky nut butters. Nuts and seeds. Beans and lentils. Berniece Salines. Hot dogs. Sausage. Dairy High-fat cheeses. Whole milk, chocolate milk, and beverages made with milk, such as milk shakes. Half-and-half. Cream. sour cream. Ice cream. Beverages Caffeinated beverages (such as coffee, tea, soda, or energy drinks). Alcoholic beverages. Fruit juices with pulp. Prune juice. Soft drinks sweetened with high-fructose corn syrup or sugar alcohols. High-calorie sports drinks. Fats and oils Butter. Cream sauces. Margarine. Salad oils. Plain salad dressings. Olives. Avocados. Mayonnaise. Sweets and desserts Sweet rolls, doughnuts, and sweet breads. Sugar-free desserts sweetened with sugar alcohols such as xylitol and sorbitol. Seasoning and other foods Honey. Hot sauce. Chili powder. Gravy. Cream-based or milk-based soups. Pancakes and waffles. Summary  When you have diarrhea, the foods you eat and your eating habits are very important.  Make sure you get at least 8-10 cups of fluid each day, or enough to keep your urine clear or pale yellow.  Eat bland foods  and gradually reintroduce healthy, nutrient-rich foods as tolerated, or as told by your health care provider.  Avoid high-fiber, fried, greasy, or spicy foods. This information is not intended to replace advice given to you by your health care provider. Make sure you discuss any questions you have with your health care provider. Document Released: 01/10/2004 Document Revised: 10/17/2016 Document Reviewed: 10/17/2016 Elsevier Interactive Patient Education  [employer].

## 2018-07-15 NOTE — Progress Notes (Signed)
Subjective:    Patient ID: Joyce Montoya, female    DOB: 1995/11/28, 22 y.o.   MRN: [id number]  HPI  [name] is a 22 year old female who presents today with a chief complaint of nausea and vomiting.  Her symptoms began at 12 am this morning. She woke at 12 am and vomited once. She fell back asleep, woke at 2:30 am and vomited. She fell back asleep and woke up at 4 am vomited again. She vomited every 30 minutes until 6 am when she started vomiting every 20 minutes. She's been vomiting every 20 minutes since then.   She had [place] take out food for lunch yesterday, cooked dinner at home last night. Her boyfriend ate dinner with her last night and has no symptoms.    She also reports diarrhea that began around 4-5 am this morning, she's had 4-5 episodes since 4 am this morning. She does have bilateral lower quadrant abdominal cramping intermittently just prior to vomiting.   She denies bloody stools, fevers. She's been sipping on water slowly and is able to keep down.  Review of Systems  Constitutional: Negative for chills and fever.  Gastrointestinal: Positive for diarrhea, nausea and vomiting.       Abdominal cramping  Neurological: Positive for headaches.       Past Medical History:  Diagnosis Date  . Frequent headaches   . History of chlamydia   . Irregular menses   . SAB (spontaneous abortion)   . SAB (spontaneous abortion) 11/2016     Social History   Socioeconomic History  . Marital status: Single    Spouse name: Not on file  . Number of children: Not on file  . Years of education: Not on file  . Highest education level: Not on file  Occupational History  . Not on file  Social Needs  . Financial resource strain: Not on file  . Food insecurity:    Worry: Not on file    Inability: Not on file  . Transportation needs:    Medical: Not on file    Non-medical: Not on file  Tobacco Use  . Smoking status: Current Every Day Smoker    Packs/day: 0.25    Types:  Cigarettes  . Smokeless tobacco: Never Used  Substance and Sexual Activity  . Alcohol use: Yes    Comment: social  . Drug use: No  . Sexual activity: Yes    Partners: Male    Birth control/protection: None  Lifestyle  . Physical activity:    Days per week: Not on file    Minutes per session: Not on file  . Stress: Not on file  Relationships  . Social connections:    Talks on phone: Not on file    Gets together: Not on file    Attends religious service: Not on file    Active member of club or organization: Not on file    Attends meetings of clubs or organizations: Not on file    Relationship status: Not on file  . Intimate partner violence:    Fear of current or ex partner: Not on file    Emotionally abused: Not on file    Physically abused: Not on file    Forced sexual activity: Not on file  Other Topics Concern  . Not on file  Social History Narrative   Single.   Works for [employer].   Enjoys riding her horses.     Past Surgical History:  Procedure Laterality Date  . HAND SURGERY Right 06/2016    Family History  Problem Relation Age of Onset  . Headache Mother   . Ovarian cancer Mother   . Headache Sister     No Known Allergies  Current Outpatient Medications on File Prior to Visit  Medication Sig Dispense Refill  . [name] 1/20 1-20 MG-MCG tablet TAKE 1 TABLET BY MOUTH EVERY DAY 28 tablet 4   No current facility-administered medications on file prior to visit.     BP 104/70   Pulse (!) 104   Temp 98.2 F (36.8 C) (Oral)   Ht 5\' 1"  (1.549 m)   Wt 114 lb (51.7 kg)   LMP 06/20/2018   SpO2 98%   BMI 21.54 kg/m    Objective:   Physical Exam  Constitutional: She appears well-nourished.  Cardiovascular: Normal rate and regular rhythm.  Respiratory: Effort normal.  GI: Soft. Bowel sounds are normal. There is no tenderness.  Skin: Skin is warm and dry.           Assessment & Plan:  Nausea and Vomiting:  Present for the last 9.5  hours, also with some diarrhea. Exam overall stable. HPI and presentation suggestive of food poisoning, likely viral. IM Phenergan provided today, she has a driver. Rx for ODT Zofran sent to pharmacy to use PRN. Discussed importance of hydration, avoid antidiarrheals for now. She will update in 24 hours if no improvement.   [name], NP

## 2018-07-15 NOTE — Telephone Encounter (Signed)
Copied from [place] [id number]. Topic: Quick Communication - See Telephone Encounter >> Jul 15, 2018  1:31 PM Blase Mess A wrote: CRM for notification. See Telephone encounter for: 07/15/18. Patient was in to see Joyce Montoya today.  She received a shot for naseu.  And she received some pills.  She want to know how soon should she start taking the pills?   Patient would like to know how long should she expect the food poising last?    Please advise Patient Call back.  [phone]

## 2018-07-15 NOTE — Telephone Encounter (Signed)
She should be feeling better within 24 hours. I instructed her to notify if no improvement by that time as noted on AVS. She can start taking the Zofran now and then every 8 hours as needed.

## 2018-07-16 NOTE — Telephone Encounter (Signed)
Per DPR, left detail message of Kate Clark's comments for patient. 

## 2018-07-30 ENCOUNTER — Ambulatory Visit (INDEPENDENT_AMBULATORY_CARE_PROVIDER_SITE_OTHER): Payer: 59 | Admitting: Obstetrics and Gynecology

## 2018-07-30 ENCOUNTER — Other Ambulatory Visit: Payer: Self-pay | Admitting: Obstetrics and Gynecology

## 2018-07-30 ENCOUNTER — Encounter: Payer: Self-pay | Admitting: Obstetrics and Gynecology

## 2018-07-30 VITALS — BP 112/62 | HR 78 | Ht 61.0 in | Wt 118.0 lb

## 2018-07-30 DIAGNOSIS — N898 Other specified noninflammatory disorders of vagina: Secondary | ICD-10-CM | POA: Diagnosis not present

## 2018-07-30 MED ORDER — METRONIDAZOLE 500 MG PO TABS: 500.0000 mg | ORAL_TABLET | Freq: Two times a day (BID) | ORAL | 1 refills | Status: AC

## 2018-07-30 NOTE — Progress Notes (Signed)
Patient ID: Joyce Montoya, female   DOB: [birth year], 22 y.o.   MRN: [id number]  Reason for Consult: Vaginitis (Was itching, no odor, white discharge x 1 week )   Referred by [name], NP  Subjective:     HPI:  Joyce Montoya is a 22 y.o. female she present today with complaints of a yeast infection. She noticed a thick white vaginal discharge earlier this week. She used OTC vaginal wipes and performed a vaginal douche. She feels like her symptoms have been improving. She has noticed and odor, but feels like that has also improved. She reports that she has issues with infections occasionally. She was taking flagyl that she had for 1-2 does occasionally just for a day which has helped keep her infections in check. She has symptoms of vaginal infections occasionally, about every 6 months.   Past Medical History:  Diagnosis Date  . Frequent headaches   . History of chlamydia   . Irregular menses   . SAB (spontaneous abortion)   . SAB (spontaneous abortion) 11/2016   Family History  Problem Relation Age of Onset  . Headache Mother   . Ovarian cancer Mother   . Headache Sister    Past Surgical History:  Procedure Laterality Date  . HAND SURGERY Right 06/2016    Short Social History:  Social History   Tobacco Use  . Smoking status: Current Every Day Smoker    Packs/day: 0.25    Types: Cigarettes  . Smokeless tobacco: Never Used  Substance Use Topics  . Alcohol use: Yes    Comment: social    No Known Allergies  Current Outpatient Medications  Medication Sig Dispense Refill  . [name] 1/20 1-20 MG-MCG tablet TAKE 1 TABLET BY MOUTH EVERY DAY 28 tablet 4  . metroNIDAZOLE (FLAGYL) 500 MG tablet Take 1 tablet (500 mg total) by mouth 2 (two) times daily for 7 days. 14 tablet 1  . ondansetron (ZOFRAN ODT) 4 MG disintegrating tablet Take 1 tablet (4 mg total) by mouth every 8 (eight) hours as needed for nausea or vomiting. (Patient not taking: Reported on 07/30/2018) 20  tablet 0   No current facility-administered medications for this visit.     Review of Systems  Constitutional: Negative for chills, fatigue, fever and unexpected weight change.  HENT: Negative for trouble swallowing.  Eyes: Negative for loss of vision.  Respiratory: Negative for cough, shortness of breath and wheezing.  Cardiovascular: Negative for chest pain, leg swelling, palpitations and syncope.  GI: Negative for abdominal pain, blood in stool, diarrhea, nausea and vomiting.  GU: Negative for difficulty urinating, dysuria, frequency and hematuria.  Musculoskeletal: Negative for back pain, leg pain and joint pain.  Skin: Negative for rash.  Neurological: Negative for dizziness, headaches, light-headedness, numbness and seizures.  Psychiatric: Negative for behavioral problem, confusion, depressed mood and sleep disturbance.        Objective:  Objective   Vitals:   07/30/18 1004  BP: 112/62  Pulse: 78  Weight: 118 lb (53.5 kg)  Height: 5\' 1"  (1.549 m)   Body mass index is 22.3 kg/m.  Physical Exam  Constitutional: She is oriented to person, place, and time. She appears well-developed and well-nourished.  HENT:  Head: Normocephalic and atraumatic.  Eyes: Pupils are equal, round, and reactive to light. EOM are normal.  Cardiovascular: Normal rate and regular rhythm.  Pulmonary/Chest: Effort normal. No respiratory distress.  Genitourinary: Vagina normal and uterus normal.  Genitourinary Comments: No vulvar  or vaginal lesions. Increased thick white discharge, not clumpiness. No purulent discharge.   Neurological: She is alert and oriented to person, place, and time.  Skin: Skin is warm and dry.  Psychiatric: She has a normal mood and affect. Her behavior is normal. Judgment and thought content normal.  Nursing note and vitals reviewed.   Wet Prep: Clue Cells: Negative Fungal elements: Negative Trichomonas: Negative Increased WBC noted.      Assessment/Plan:    23  yo with increased vaginal discharge and itching Wet mount normal, showed increased WBC but no clue cells or yeast. Will send Nuswab to confirm. Will presumptively treat with flagyl until results return.   More than 15 minutes were spent face to face with the patient in the room with more than 50% of the time spent providing counseling and discussing the plan of management. We discussed how to prevent and minimize vaginal infections and her plan of care.   Adrian Prows MD [facility], [facility] 07/30/18 10:53 AM

## 2018-08-02 LAB — NUSWAB VAGINITIS PLUS (VG+)
Candida albicans, NAA: NEGATIVE
Candida glabrata, NAA: NEGATIVE
Chlamydia trachomatis, NAA: NEGATIVE
NEISSERIA GONORRHOEAE, NAA: NEGATIVE
Trich vag by NAA: NEGATIVE

## 2018-08-04 NOTE — Progress Notes (Signed)
Please call patient with normal result. Thank you,  Dr. Schuman

## 2018-08-04 NOTE — Progress Notes (Signed)
Pt aware via vm 

## 2018-08-13 ENCOUNTER — Ambulatory Visit (INDEPENDENT_AMBULATORY_CARE_PROVIDER_SITE_OTHER): Payer: 59 | Admitting: Obstetrics and Gynecology

## 2018-08-13 ENCOUNTER — Encounter: Payer: Self-pay | Admitting: Obstetrics and Gynecology

## 2018-08-13 VITALS — BP 110/58 | HR 94 | Ht 61.0 in | Wt 121.0 lb

## 2018-08-13 DIAGNOSIS — Z3201 Encounter for pregnancy test, result positive: Secondary | ICD-10-CM

## 2018-08-13 DIAGNOSIS — N946 Dysmenorrhea, unspecified: Secondary | ICD-10-CM

## 2018-08-13 DIAGNOSIS — Z3480 Encounter for supervision of other normal pregnancy, unspecified trimester: Secondary | ICD-10-CM

## 2018-08-13 LAB — POCT URINE PREGNANCY: PREG TEST UR: POSITIVE — AB

## 2018-08-13 NOTE — Progress Notes (Signed)
Patient ID: Joyce Montoya, female   DOB: July 17, 1996, 22 y.o.   MRN: [id number]  Reason for Consult: Follow-up (Mass she can move in her LQ, mild tenderness with touch, intercourse aggivates it )   Referred by [name], NP  Subjective:     HPI:  Joyce Montoya is a 22 y.o. female . She presents today for concerns that she may be pregnant. She did not want to take a home pregnancy test. She reports that with intercourse she has had discomfort. When she had intercourse last night she felt movement. She reports that her last menstrual period was 2 weeks ago and she had 4 days of bleeding. She has been taking her oral contraceptive pill regularly. This is a second unplanned pregnancy.   Past Medical History:  Diagnosis Date  . Frequent headaches   . History of chlamydia   . Irregular menses   . SAB (spontaneous abortion)   . SAB (spontaneous abortion) 11/2016   Family History  Problem Relation Age of Onset  . Headache Mother   . Ovarian cancer Mother   . Headache Sister    Past Surgical History:  Procedure Laterality Date  . HAND SURGERY Right 06/2016    Short Social History:  Social History   Tobacco Use  . Smoking status: Current Every Day Smoker    Packs/day: 0.25    Types: Cigarettes  . Smokeless tobacco: Never Used  Substance Use Topics  . Alcohol use: Yes    Comment: social    No Known Allergies  Current Outpatient Medications  Medication Sig Dispense Refill  . [name] 1/20 1-20 MG-MCG tablet TAKE 1 TABLET BY MOUTH EVERY DAY 28 tablet 4  . ondansetron (ZOFRAN ODT) 4 MG disintegrating tablet Take 1 tablet (4 mg total) by mouth every 8 (eight) hours as needed for nausea or vomiting. (Patient not taking: Reported on 07/30/2018) 20 tablet 0   No current facility-administered medications for this visit.     Review of Systems  Constitutional: Negative for chills, fatigue, fever and unexpected weight change.  HENT: Negative for trouble swallowing.  Eyes:  Negative for loss of vision.  Respiratory: Negative for cough, shortness of breath and wheezing.  Cardiovascular: Negative for chest pain, leg swelling, palpitations and syncope.  GI: Negative for abdominal pain, blood in stool, diarrhea, nausea and vomiting.  GU: Negative for difficulty urinating, dysuria, frequency and hematuria.  Musculoskeletal: Negative for back pain, leg pain and joint pain.  Skin: Negative for rash.  Neurological: Negative for dizziness, headaches, light-headedness, numbness and seizures.  Psychiatric: Negative for behavioral problem, confusion, depressed mood and sleep disturbance.        Objective:  Objective   Vitals:   08/13/18 1408  BP: (!) 110/58  Pulse: 94  Weight: 121 lb (54.9 kg)  Height: 5\' 1"  (1.549 m)   Body mass index is 22.86 kg/m.  Physical Exam  Constitutional: She is oriented to person, place, and time. She appears well-developed and well-nourished.  HENT:  Head: Normocephalic and atraumatic.  Eyes: Pupils are equal, round, and reactive to light. EOM are normal.  Cardiovascular: Normal rate and regular rhythm.  Pulmonary/Chest: Effort normal. No respiratory distress.  Neurological: She is alert and oriented to person, place, and time.  Skin: Skin is warm and dry.  Psychiatric: She has a normal mood and affect. Her behavior is normal. Judgment and thought content normal.  Nursing note and vitals reviewed.  Bedside Ultrasound showed a IUP approximately 15  weeks      Assessment/Plan:     22 yo with IUP at approximately [redacted] weeks gestation. Discussed options for continuing pregnancy versus termination. Will have her follow up next week for NOB and dating [place].   More than 25 minutes were spent face to face with the patient in the room with more than 50% of the time spent providing counseling and discussing the plan of management.     Adrian Prows MD [facility], [facility] 08/13/18 5:36 PM

## 2018-08-23 ENCOUNTER — Ambulatory Visit (INDEPENDENT_AMBULATORY_CARE_PROVIDER_SITE_OTHER): Payer: 59

## 2018-08-23 ENCOUNTER — Encounter: Payer: 59 | Admitting: Maternal Newborn

## 2018-08-23 DIAGNOSIS — Z3480 Encounter for supervision of other normal pregnancy, unspecified trimester: Secondary | ICD-10-CM

## 2018-08-23 DIAGNOSIS — Z362 Encounter for other antenatal screening follow-up: Secondary | ICD-10-CM | POA: Diagnosis not present

## 2018-08-27 ENCOUNTER — Encounter: Payer: Self-pay | Admitting: Maternal Newborn

## 2018-08-27 ENCOUNTER — Ambulatory Visit (INDEPENDENT_AMBULATORY_CARE_PROVIDER_SITE_OTHER): Payer: 59 | Admitting: Maternal Newborn

## 2018-08-27 ENCOUNTER — Encounter

## 2018-08-27 VITALS — BP 92/56 | Wt 122.0 lb

## 2018-08-27 DIAGNOSIS — Z1379 Encounter for other screening for genetic and chromosomal anomalies: Secondary | ICD-10-CM

## 2018-08-27 DIAGNOSIS — Z3480 Encounter for supervision of other normal pregnancy, unspecified trimester: Secondary | ICD-10-CM

## 2018-08-27 DIAGNOSIS — Z3482 Encounter for supervision of other normal pregnancy, second trimester: Secondary | ICD-10-CM

## 2018-08-27 DIAGNOSIS — Z3A16 16 weeks gestation of pregnancy: Secondary | ICD-10-CM

## 2018-08-27 DIAGNOSIS — Z3689 Encounter for other specified antenatal screening: Secondary | ICD-10-CM

## 2018-08-27 LAB — POCT URINALYSIS DIPSTICK OB
Glucose, UA: NEGATIVE
PROTEIN: NEGATIVE

## 2018-08-27 NOTE — Progress Notes (Signed)
08/27/2018   Chief Complaint: Desires prenatal care.  Transfer of Care Patient: No  History of Present Illness: Ms. Labarbera is a 22 y.o. G3P0020 at 16w 5d based on Ultrasound, with an Estimated Date of Delivery: 02/06/2019, with the above CC.   Her periods were: regular periods every month She was using oral contraceptives (estrogen/progesterone) when she conceived.  She has Negative signs or symptoms of nausea/vomiting of pregnancy. She has Negative signs or symptoms of miscarriage or preterm labor She identifies Negative Zika risk factors for her and her partner On any different medications around the time she conceived/early pregnancy: OCP History of varicella: No    Review of Systems  Constitutional: Negative.   HENT: Negative.   Eyes: Negative.   Respiratory: Negative for cough, shortness of breath and wheezing.   Cardiovascular: Negative for chest pain and palpitations.  Gastrointestinal: Negative for nausea and vomiting.  Genitourinary: Positive for frequency.  Musculoskeletal: Negative.   Skin: Negative.   Neurological: Positive for headaches.  Endo/Heme/Allergies: Negative.   Psychiatric/Behavioral: Negative.    Review of systems was otherwise negative, except as stated in the above HPI.  OBGYN History: As per HPI. OB History  Gravida Para Term Preterm AB Living  3       2    SAB TAB Ectopic Multiple Live Births  1 1          # Outcome Date GA Lbr Len/2nd Weight Sex Delivery Anes PTL Lv  3 Current           2 SAB 11/2016          1 TAB             Any issues with any prior pregnancies: yes, 1 miscarriage and 1 TAB  Any prior children are healthy, doing well, without any problems or issues: not applicable History of pap smears: Yes. Last pap smear 10/22/2016. NILM History of STIs: Yes, remote history of chlamydia 2+ years ago   Past Medical History: Past Medical History:  Diagnosis Date  . Frequent headaches   . History of chlamydia   . Irregular menses     . SAB (spontaneous abortion)   . SAB (spontaneous abortion) 11/2016    Past Surgical History: Past Surgical History:  Procedure Laterality Date  . HAND SURGERY Right 06/2016    Family History:  Family History  Problem Relation Age of Onset  . Headache Mother   . Ovarian cysts Mother   . Headache Sister    She denies any female cancers, bleeding or blood clotting disorders.  The FOB's sister has a child with Down's syndrome and a heart defect. She reports no other history of intellectual disability, birth defects or genetic disorders in her or the FOB's history  Social History:  Social History   Socioeconomic History  . Marital status: Single    Spouse name: Not on file  . Number of children: Not on file  . Years of education: Not on file  . Highest education level: Not on file  Occupational History  . Not on file  Social Needs  . Financial resource strain: Not on file  . Food insecurity:    Worry: Not on file    Inability: Not on file  . Transportation needs:    Medical: Not on file    Non-medical: Not on file  Tobacco Use  . Smoking status: Former Smoker    Packs/day: 0.25    Types: Cigarettes  . Smokeless tobacco: Never Used  Substance and Sexual Activity  . Alcohol use: Not Currently    Comment: social  . Drug use: No  . Sexual activity: Yes    Partners: Male  Lifestyle  . Physical activity:    Days per week: Not on file    Minutes per session: Not on file  . Stress: Not on file  Relationships  . Social connections:    Talks on phone: Not on file    Gets together: Not on file    Attends religious service: Not on file    Active member of club or organization: Not on file    Attends meetings of clubs or organizations: Not on file    Relationship status: Not on file  . Intimate partner violence:    Fear of current or ex partner: Not on file    Emotionally abused: Not on file    Physically abused: Not on file    Forced sexual activity: Not on file   Other Topics Concern  . Not on file  Social History Narrative   Single.   Works for [employer].   Enjoys riding her horses.    Any cats in the household: Yes, cat is indoor/outdoor; she is aware to avoid feces and litter.  Domestic violence screening negative.  Allergy: No Known Allergies  Current Outpatient Medications: No current outpatient medications on file.   Physical Exam:   BP (!) 92/56   Wt 122 lb (55.3 kg)   LMP 07/29/2018 (Exact Date)   BMI 23.05 kg/m  Body mass index is 23.05 kg/m. Constitutional: Well nourished, well developed female in no acute distress.  Neck:  Supple, normal appearance, and no thyromegaly  Cardiovascular: S1, S2 normal, no murmur, rub or gallop, regular rate and rhythm Respiratory:  Clear to auscultation bilateral. Normal respiratory effort Abdomen: No masses, hernias; diffusely non tender to palpation, non distended.  Breasts: breasts appear normal, no suspicious masses, no skin or nipple changes or axillary nodes. Neuro/Psych:  Normal mood and affect.  Skin:  Warm and dry.  Lymphatic:  No inguinal lymphadenopathy.   Pelvic exam: deferred, recently done on 9/27 at visit for vaginitis, negative for GC/Chlamydia, BV, yeast and trichomonas at that visit and no symptoms since then. Pap up to date.  Assessment: Ms. Smucker is a 22 y.o. G3P0020 at [place] based on Ultrasound, with an Estimated Date of Delivery: 02/06/2019, presenting for prenatal care.  Plan:  1) Avoid alcoholic beverages. 2) Patient encouraged not to smoke.  3) Discontinue the use of all non-medicinal drugs and chemicals.  4) Take prenatal vitamins daily.  5) Seatbelt use advised 6) Nutrition, food safety (fish, cheese advisories, and high nitrite foods) and exercise discussed. 7) Hospital and practice style delivering at [facility] discussed  8) Patient is asked about travel to areas at risk for the [place] virus, and counseled to avoid travel and exposure to mosquitoes or  sexual partners who may have themselves been exposed to the virus. Testing is discussed, and will be ordered as appropriate.  9) Childbirth classes at [facility] advised 10) Genetic Screening, such as with 1st Trimester Screening, cell free fetal DNA, AFP testing, and Ultrasound, as well as with amniocentesis and CVS as appropriate, is discussed with patient. She plans to have genetic testing this pregnancy. 11) FHR present at 160 bpm via Doppler. 12) Reviewed ultrasound results from 10/21 scan. 13) NOB labs, [id number] and Inheritest today.  Problem list reviewed and updated.  Return in about 4 weeks (around 09/24/2018) for  ROB and anatomy scan.  Avel Sensor, CNM [facility] 08/27/2018  4:58 PM

## 2018-08-27 NOTE — Progress Notes (Signed)
NOB- no concerns ?

## 2018-08-28 LAB — URINE DRUG PANEL 7
Amphetamines, Urine: NEGATIVE ng/mL
BARBITURATE QUANT UR: NEGATIVE ng/mL
BENZODIAZEPINE QUANT UR: NEGATIVE ng/mL
Cannabinoid Quant, Ur: NEGATIVE ng/mL
Cocaine (Metab.): NEGATIVE ng/mL
OPIATE QUANT UR: NEGATIVE ng/mL
PCP Quant, Ur: NEGATIVE ng/mL

## 2018-08-29 LAB — URINE CULTURE: Organism ID, Bacteria: NO GROWTH

## 2018-09-02 ENCOUNTER — Telehealth: Payer: Self-pay

## 2018-09-02 NOTE — Telephone Encounter (Signed)
Pt inquiring about lab results from 08/27/18. [phone]

## 2018-09-02 NOTE — Telephone Encounter (Signed)
Pt advised results not back yet.

## 2018-09-03 LAB — MATERNIT 21 PLUS CORE, BLOOD
CHROMOSOME 13: NEGATIVE
CHROMOSOME 18: NEGATIVE
Chromosome 21: NEGATIVE
Y CHROMOSOME: DETECTED

## 2018-09-07 ENCOUNTER — Encounter: Payer: 59 | Admitting: Certified Nurse Midwife

## 2018-09-07 ENCOUNTER — Ambulatory Visit (INDEPENDENT_AMBULATORY_CARE_PROVIDER_SITE_OTHER): Payer: 59 | Admitting: Advanced Practice Midwife

## 2018-09-07 ENCOUNTER — Encounter: Payer: Self-pay | Admitting: Advanced Practice Midwife

## 2018-09-07 VITALS — BP 108/58 | Wt 124.0 lb

## 2018-09-07 DIAGNOSIS — N3000 Acute cystitis without hematuria: Secondary | ICD-10-CM

## 2018-09-07 DIAGNOSIS — Z3A18 18 weeks gestation of pregnancy: Secondary | ICD-10-CM

## 2018-09-07 LAB — POCT URINALYSIS DIPSTICK
Bilirubin, UA: NEGATIVE
Glucose, UA: POSITIVE — AB
Ketones, UA: NEGATIVE
Nitrite, UA: NEGATIVE
PH UA: 6 (ref 5.0–8.0)
PROTEIN UA: NEGATIVE
Spec Grav, UA: 1.02 (ref 1.010–1.025)
UROBILINOGEN UA: 1 U/dL

## 2018-09-07 NOTE — Progress Notes (Signed)
ROB Work-in UTI Bladder pain/pressure

## 2018-09-07 NOTE — Progress Notes (Signed)
Routine Prenatal Care Visit  Subjective  Joyce Montoya is a 22 y.o. G3P0020 at [redacted]w[redacted]d being seen today for ongoing prenatal care.  She is currently monitored for the following issues for this low-risk pregnancy and has Other fatigue; Frequent headaches; Seasonal allergies; Abdominal pain in female patient; Chronic diarrhea; Chronic sore throat; Loss of appetite for more than 2 weeks; Menorrhagia with irregular cycle; Depression screening; and Supervision of other normal pregnancy, antepartum on their problem list.  ----------------------------------------------------------------------------------- Patient reports pain and pressure in her bladder area for the past 5 days. She denies burning. She has some frequency and some inability to empty her bladder. She also has complaint of constipation. Urine culture sent today. She prefers to wait for results before starting abx. Reviewed OTC and other bowel remedies. .    Krista Blue. Bleeding: None.  Movement: Absent. Denies leaking of fluid.  ----------------------------------------------------------------------------------- The following portions of the patient's history were reviewed and updated as appropriate: allergies, current medications, past family history, past medical history, past social history, past surgical history and problem list. Problem list updated.   Objective  Blood pressure (!) 108/58, weight 124 lb (56.2 kg), last menstrual period 07/29/2018. Pregravid weight 115 lb (52.2 kg) Total Weight Gain 9 lb (4.082 kg) Urinalysis: Urine Protein    Urine Glucose    Fetal Status: Fetal Heart Rate (bpm): 152   Movement: Absent     General:  Alert, oriented and cooperative. Patient is in no acute distress.  Skin: Skin is warm and dry. No rash noted.   Cardiovascular: Normal heart rate noted  Respiratory: Normal respiratory effort, no problems with respiration noted  Abdomen: Soft, gravid, appropriate for gestational age. Pain/Pressure: Present      Pelvic:  Cervical exam deferred        Extremities: Normal range of motion.  Edema: None  Mental Status: Normal mood and affect. Normal behavior. Normal judgment and thought content.   Assessment   22 y.o. G3P0020 at [redacted]w[redacted]d by  02/06/2019, by Ultrasound presenting for work-in prenatal visit  Plan   FIRST Problems (from 08/27/18 to present)    Problem Noted Resolved   Supervision of other normal pregnancy, antepartum 08/27/2018 by Rexene Agent, CNM No   Overview Signed 08/27/2018 11:32 AM by Rexene Agent, [place] Prenatal Labs  Dating  Blood type:     Genetic Screen 1 Screen:    AFP:     Quad:     NIPS: Antibody:   Anatomic [place]  Rubella:   Varicella:    GTT Early:               Third trimester:  RPR:     Rhogam  HBsAg:     TDaP vaccine                       Flu Shot: HIV:     Baby Food                                GBS:   Contraception  Pap:  CBB     CS/VBAC    Support Person                 Urine culture sent- will await results prior to starting antibiotics Preterm labor symptoms and general obstetric precautions including but not limited to vaginal bleeding, contractions, leaking of fluid and fetal movement  were reviewed in detail with the patient.   Return for has follow up scheduled.  Rod Can, CNM 09/07/2018 2:40 PM

## 2018-09-09 LAB — URINE CULTURE: ORGANISM ID, BACTERIA: NO GROWTH

## 2018-09-10 LAB — RPR+RH+ABO+RUB AB+AB SCR+CB...
ANTIBODY SCREEN: NEGATIVE
HEP B S AG: NEGATIVE
HIV SCREEN 4TH GENERATION: NONREACTIVE
Hematocrit: 31.6 % — ABNORMAL LOW (ref 34.0–46.6)
Hemoglobin: 10.9 g/dL — ABNORMAL LOW (ref 11.1–15.9)
MCH: 32.3 pg (ref 26.6–33.0)
MCHC: 34.5 g/dL (ref 31.5–35.7)
MCV: 94 fL (ref 79–97)
PLATELETS: 206 10*3/uL (ref 150–450)
RBC: 3.37 x10E6/uL — AB (ref 3.77–5.28)
RDW: 12.7 % (ref 12.3–15.4)
RPR: NONREACTIVE
Rh Factor: POSITIVE
Rubella Antibodies, IGG: 1.52 index (ref >0.99)
Varicella zoster IgG: 290 index (ref >165)
WBC: 6.3 10*3/uL (ref 3.4–10.8)

## 2018-09-10 LAB — INHERITEST CORE(CF97,SMA,FRAX)

## 2018-09-24 ENCOUNTER — Ambulatory Visit (INDEPENDENT_AMBULATORY_CARE_PROVIDER_SITE_OTHER): Payer: 59

## 2018-09-24 ENCOUNTER — Ambulatory Visit (INDEPENDENT_AMBULATORY_CARE_PROVIDER_SITE_OTHER): Payer: 59 | Admitting: Certified Nurse Midwife

## 2018-09-24 VITALS — BP 98/42 | Wt 127.0 lb

## 2018-09-24 DIAGNOSIS — Z3A20 20 weeks gestation of pregnancy: Secondary | ICD-10-CM

## 2018-09-24 DIAGNOSIS — Z3480 Encounter for supervision of other normal pregnancy, unspecified trimester: Secondary | ICD-10-CM

## 2018-09-24 DIAGNOSIS — Z3482 Encounter for supervision of other normal pregnancy, second trimester: Secondary | ICD-10-CM

## 2018-09-24 DIAGNOSIS — Z3689 Encounter for other specified antenatal screening: Secondary | ICD-10-CM

## 2018-09-24 DIAGNOSIS — Z363 Encounter for antenatal screening for malformations: Secondary | ICD-10-CM

## 2018-09-24 NOTE — Progress Notes (Signed)
ROB and anatomy scan at [facility]: CGA 20wk3d, normal anatomy, anterior placenta Just starting to feel some FM. ROB in 4 weeks. Dalia Heading, CNM

## 2018-09-24 NOTE — Progress Notes (Signed)
ROB  Anatomy scan/ It is a BOY!! 

## 2018-10-22 ENCOUNTER — Ambulatory Visit (INDEPENDENT_AMBULATORY_CARE_PROVIDER_SITE_OTHER): Payer: 59 | Admitting: Obstetrics and Gynecology

## 2018-10-22 ENCOUNTER — Encounter: Payer: Self-pay | Admitting: Obstetrics and Gynecology

## 2018-10-22 VITALS — BP 100/58 | Wt 134.0 lb

## 2018-10-22 DIAGNOSIS — Z3482 Encounter for supervision of other normal pregnancy, second trimester: Secondary | ICD-10-CM

## 2018-10-22 DIAGNOSIS — Z3480 Encounter for supervision of other normal pregnancy, unspecified trimester: Secondary | ICD-10-CM

## 2018-10-22 DIAGNOSIS — Z3A24 24 weeks gestation of pregnancy: Secondary | ICD-10-CM

## 2018-10-22 NOTE — Progress Notes (Signed)
    Routine Prenatal Care Visit  Subjective  Joyce Montoya is a 22 y.o. G3P0020 at [redacted]w[redacted]d being seen today for ongoing prenatal care.  She is currently monitored for the following issues for this low-risk pregnancy and has Other fatigue; Frequent headaches; Seasonal allergies; Abdominal pain in female patient; Chronic diarrhea; Chronic sore throat; Loss of appetite for more than 2 weeks; Menorrhagia with irregular cycle; Depression screening; and Supervision of other normal pregnancy, antepartum on their problem list.  ----------------------------------------------------------------------------------- Patient reports headache.   Contractions: Not present. Vag. Bleeding: None.  Movement: Present. Denies leaking of fluid.  ----------------------------------------------------------------------------------- The following portions of the patient's history were reviewed and updated as appropriate: allergies, current medications, past family history, past medical history, past social history, past surgical history and problem list. Problem list updated.   Objective  Blood pressure (!) 100/58, weight 134 lb (60.8 kg), last menstrual period 07/29/2018. Pregravid weight 115 lb (52.2 kg) Total Weight Gain 19 lb (8.618 kg) Urinalysis:      Fetal Status: Fetal Heart Rate (bpm): 150 Fundal Height: 24 cm Movement: Present     General:  Alert, oriented and cooperative. Patient is in no acute distress.  Skin: Skin is warm and dry. No rash noted.   Cardiovascular: Normal heart rate noted  Respiratory: Normal respiratory effort, no problems with respiration noted  Abdomen: Soft, gravid, appropriate for gestational age. Pain/Pressure: Absent     Pelvic:  Cervical exam performed        Extremities: Normal range of motion.  Edema: None  Mental Status: Normal mood and affect. Normal behavior. Normal judgment and thought content.     Assessment   22 y.o. G3P0020 at [redacted]w[redacted]d by  02/06/2019, by Ultrasound  presenting for routine prenatal visit  Plan   FIRST Problems (from 08/27/18 to present)    Problem Noted Resolved   Supervision of other normal pregnancy, antepartum 08/27/2018 by Rexene Agent, CNM No   Overview Addendum 09/24/2018 10:41 AM by Dalia Heading, [place] Prenatal Labs  Dating 16wk1d ultrasound Blood type: B/Positive/-- (10/25 1158)   Genetic Screen 1 Screen:    AFP:     Quad:     NIPS: diploid XY Antibody:Negative (10/25 1158)  Anatomic [place] Normal, anterior placenta Rubella: 1.52 (10/25 1158) Varicella:  Immune  GTT Early:               Third trimester:  RPR: Non Reactive (10/25 1158)   Rhogam N/A HBsAg: Negative (10/25 1158)   TDaP vaccine                       Flu Shot: HIV: Non Reactive (10/25 1158)   Baby Food                                GBS:   Contraception  Pap:  CBB     CS/VBAC    Support Person                  Gestational age appropriate obstetric precautions including but not limited to vaginal bleeding, contractions, leaking of fluid and fetal movement were reviewed in detail with the patient.    Return in about 4 weeks (around 11/19/2018) for ROB and 1GTT.  Homero Fellers MD [facility], [facility] 10/22/2018, 8:57 AM

## 2018-10-22 NOTE — Progress Notes (Signed)
ROB C/o headaches all the time Declined flu shot

## 2018-11-03 NOTE — L&D Delivery Note (Signed)
Delivery Note At 3:53 PM a viable female was delivered via Vaginal, Spontaneous (Presentation:ROA, compound left arm).  APGAR: 8, 9; weight  pending.   Placenta status: delivered spontaneously, intact.  Cord: 3VC with the following complications: none.  Cord pH: n/a  Anesthesia:   Episiotomy: None Lacerations: 1st degree;Vaginal Suture Repair: 3.0 vicryl Quant. Blood Loss (mL): 200  Mom to postpartum.  Baby to Couplet care / Skin to Skin.  Called to see patient.  Mom pushed to deliver a viable female infant.  The head followed by shoulders, which delivered without difficulty, and the rest of the body.  No nuchal cord noted.  Baby to mom's chest.  Cord clamped and cut after > 1 min delay.  No cord blood obtained.  Placenta delivered spontaneously, intact, with a 3-vessel cord.  First degree vaginal laceration repaired with 3-0 Vicryl in standard fashion.  All counts correct.  Hemostasis obtained with IV pitocin and fundal massage. QBL 200 mL.     Prentice Docker, MD 01/31/2019, 4:10 PM

## 2018-11-19 ENCOUNTER — Encounter: Payer: 59 | Admitting: Advanced Practice Midwife

## 2018-11-19 ENCOUNTER — Encounter: Payer: Self-pay | Admitting: Obstetrics and Gynecology

## 2018-11-19 ENCOUNTER — Other Ambulatory Visit: Payer: 59

## 2018-11-19 ENCOUNTER — Ambulatory Visit (INDEPENDENT_AMBULATORY_CARE_PROVIDER_SITE_OTHER): Payer: 59 | Admitting: Obstetrics and Gynecology

## 2018-11-19 VITALS — BP 100/58 | Wt 139.0 lb

## 2018-11-19 DIAGNOSIS — Z3483 Encounter for supervision of other normal pregnancy, third trimester: Secondary | ICD-10-CM

## 2018-11-19 DIAGNOSIS — Z3A28 28 weeks gestation of pregnancy: Secondary | ICD-10-CM

## 2018-11-19 DIAGNOSIS — Z3480 Encounter for supervision of other normal pregnancy, unspecified trimester: Secondary | ICD-10-CM

## 2018-11-19 LAB — POCT URINALYSIS DIPSTICK OB
Glucose, UA: NEGATIVE
PROTEIN: NEGATIVE

## 2018-11-19 NOTE — Progress Notes (Signed)
Routine Prenatal Care Visit  Subjective  Joyce Montoya is a 23 y.o. G3P0020 at [redacted]w[redacted]d being seen today for ongoing prenatal care.  She is currently monitored for the following issues for this low-risk pregnancy and has Other fatigue; Frequent headaches; Seasonal allergies; Abdominal pain in female patient; Chronic diarrhea; Chronic sore throat; Loss of appetite for more than 2 weeks; Menorrhagia with irregular cycle; Depression screening; and Supervision of other normal pregnancy, antepartum on their problem list.  ----------------------------------------------------------------------------------- Patient reports no complaints.   Contractions: Not present. Vag. Bleeding: None.  Movement: Present. Denies leaking of fluid.  ----------------------------------------------------------------------------------- The following portions of the patient's history were reviewed and updated as appropriate: allergies, current medications, past family history, past medical history, past social history, past surgical history and problem list. Problem list updated.   Objective  Blood pressure (!) 100/58, weight 139 lb (63 kg), last menstrual period 07/29/2018. Pregravid weight 115 lb (52.2 kg) Total Weight Gain 24 lb (10.9 kg) Urinalysis: Urine Protein    Urine Glucose    Fetal Status: Fetal Heart Rate (bpm): 140 Fundal Height: 28 cm Movement: Present     General:  Alert, oriented and cooperative. Patient is in no acute distress.  Skin: Skin is warm and dry. No rash noted.   Cardiovascular: Normal heart rate noted  Respiratory: Normal respiratory effort, no problems with respiration noted  Abdomen: Soft, gravid, appropriate for gestational age. Pain/Pressure: Absent     Pelvic:  Cervical exam deferred        Extremities: Normal range of motion.  Edema: None  Mental Status: Normal mood and affect. Normal behavior. Normal judgment and thought content.   Assessment   23 y.o. G3P0020 at [redacted]w[redacted]d by  02/06/2019,  by Ultrasound presenting for routine prenatal visit  Plan   FIRST Problems (from 08/27/18 to present)    Problem Noted Resolved   Supervision of other normal pregnancy, antepartum 08/27/2018 by Rexene Agent, CNM No   Overview Addendum 10/22/2018  8:57 AM by Homero Fellers, MD    Clinic [facility] Prenatal Labs  Dating 16wk1d ultrasound Blood type: B/Positive/-- (10/25 1158)   Genetic Screen  NIPS: diploid XY Antibody:Negative (10/25 1158)  Anatomic [place] Normal, anterior placenta Rubella: 1.52 (10/25 1158) Varicella:  Immune  GTT Early:               Third trimester:  RPR: Non Reactive (10/25 1158)   Rhogam N/A HBsAg: Negative (10/25 1158)   TDaP vaccine                        Flu Shot: HIV: Non Reactive (10/25 1158)   Baby Food                                GBS:   Contraception  Pap:  CBB     CS/VBAC    Support Person                  Preterm labor symptoms and general obstetric precautions including but not limited to vaginal bleeding, contractions, leaking of fluid and fetal movement were reviewed in detail with the patient. Please refer to After Visit Summary for other counseling recommendations.   - discussed contraception - 28 week labs today - discussed birthing classes at [facility], schedule provided - plans to breastfeed  Return in about 2 weeks (around 12/03/2018) for Routine Prenatal Appointment.  Prentice Docker, MD,  [place], [facility] 11/19/2018 9:17 AM

## 2018-11-19 NOTE — Addendum Note (Signed)
Addended by: Drenda Freeze on: 11/19/2018 10:28 AM   Modules accepted: Orders

## 2018-11-20 LAB — 28 WEEK RH+PANEL
Basophils Absolute: 0 10*3/uL (ref 0.0–0.2)
Basos: 1 %
EOS (ABSOLUTE): 0.1 10*3/uL (ref 0.0–0.4)
Eos: 2 %
Gestational Diabetes Screen: 55 mg/dL — ABNORMAL LOW (ref 65–139)
HEMATOCRIT: 29.1 % — AB (ref 34.0–46.6)
HIV Screen 4th Generation wRfx: NONREACTIVE
Hemoglobin: 10 g/dL — ABNORMAL LOW (ref 11.1–15.9)
IMMATURE GRANS (ABS): 0 10*3/uL (ref 0.0–0.1)
IMMATURE GRANULOCYTES: 0 %
LYMPHS: 24 %
Lymphocytes Absolute: 1.1 10*3/uL (ref 0.7–3.1)
MCH: 32.8 pg (ref 26.6–33.0)
MCHC: 34.4 g/dL (ref 31.5–35.7)
MCV: 95 fL (ref 79–97)
Monocytes Absolute: 0.4 10*3/uL (ref 0.1–0.9)
Monocytes: 9 %
NEUTROS PCT: 64 %
Neutrophils Absolute: 2.9 10*3/uL (ref 1.4–7.0)
PLATELETS: 191 10*3/uL (ref 150–450)
RBC: 3.05 x10E6/uL — ABNORMAL LOW (ref 3.77–5.28)
RDW: 11.6 % — AB (ref 11.7–15.4)
RPR Ser Ql: NONREACTIVE
WBC: 4.5 10*3/uL (ref 3.4–10.8)

## 2018-11-22 NOTE — Progress Notes (Signed)
Anemic, released to [employer]

## 2018-12-03 ENCOUNTER — Encounter: Payer: 59 | Admitting: Maternal Newborn

## 2018-12-06 ENCOUNTER — Encounter: Payer: 59 | Admitting: Obstetrics and Gynecology

## 2018-12-10 ENCOUNTER — Telehealth: Payer: Self-pay

## 2018-12-10 ENCOUNTER — Observation Stay
Admission: EM | Admit: 2018-12-10 | Discharge: 2018-12-10 | Disposition: A | Discharge status: Home / Self Care | Payer: Medicaid Other | Attending: Obstetrics and Gynecology | Admitting: Obstetrics and Gynecology

## 2018-12-10 ENCOUNTER — Other Ambulatory Visit: Payer: Self-pay | Admitting: Obstetrics and Gynecology

## 2018-12-10 ENCOUNTER — Other Ambulatory Visit: Payer: Self-pay

## 2018-12-10 ENCOUNTER — Ambulatory Visit (INDEPENDENT_AMBULATORY_CARE_PROVIDER_SITE_OTHER): Payer: 59 | Admitting: Certified Nurse Midwife

## 2018-12-10 ENCOUNTER — Observation Stay: Payer: Medicaid Other

## 2018-12-10 ENCOUNTER — Encounter: Payer: Self-pay | Admitting: *Deleted

## 2018-12-10 VITALS — BP 104/50 | Wt 135.0 lb

## 2018-12-10 DIAGNOSIS — O26833 Pregnancy related renal disease, third trimester: Principal | ICD-10-CM

## 2018-12-10 DIAGNOSIS — Z87891 Personal history of nicotine dependence: Secondary | ICD-10-CM | POA: Insufficient documentation

## 2018-12-10 DIAGNOSIS — O9989 Other specified diseases and conditions complicating pregnancy, childbirth and the puerperium: Principal | ICD-10-CM | POA: Insufficient documentation

## 2018-12-10 DIAGNOSIS — Z3A31 31 weeks gestation of pregnancy: Secondary | ICD-10-CM

## 2018-12-10 DIAGNOSIS — R1030 Lower abdominal pain, unspecified: Secondary | ICD-10-CM

## 2018-12-10 DIAGNOSIS — Z23 Encounter for immunization: Secondary | ICD-10-CM

## 2018-12-10 DIAGNOSIS — Z3A32 32 weeks gestation of pregnancy: Secondary | ICD-10-CM | POA: Insufficient documentation

## 2018-12-10 DIAGNOSIS — R319 Hematuria, unspecified: Secondary | ICD-10-CM

## 2018-12-10 DIAGNOSIS — R3129 Other microscopic hematuria: Secondary | ICD-10-CM

## 2018-12-10 DIAGNOSIS — N1330 Unspecified hydronephrosis: Secondary | ICD-10-CM | POA: Insufficient documentation

## 2018-12-10 DIAGNOSIS — R109 Unspecified abdominal pain: Secondary | ICD-10-CM | POA: Diagnosis present

## 2018-12-10 DIAGNOSIS — O26893 Other specified pregnancy related conditions, third trimester: Secondary | ICD-10-CM

## 2018-12-10 DIAGNOSIS — Z3480 Encounter for supervision of other normal pregnancy, unspecified trimester: Secondary | ICD-10-CM

## 2018-12-10 DIAGNOSIS — N200 Calculus of kidney: Secondary | ICD-10-CM

## 2018-12-10 LAB — POCT URINALYSIS DIPSTICK
Bilirubin, UA: NEGATIVE
Glucose, UA: NEGATIVE
KETONES UA: NEGATIVE
Nitrite, UA: NEGATIVE
PH UA: 7 (ref 5.0–8.0)

## 2018-12-10 LAB — URINALYSIS, ROUTINE W REFLEX MICROSCOPIC
Bilirubin Urine: NEGATIVE
Glucose, UA: NEGATIVE mg/dL
Ketones, ur: NEGATIVE mg/dL
Leukocytes, UA: NEGATIVE
Nitrite: NEGATIVE
PH: 8 (ref 5.0–8.0)
Protein, ur: NEGATIVE mg/dL
Specific Gravity, Urine: 1.005 (ref 1.005–1.030)

## 2018-12-10 LAB — POCT URINALYSIS DIPSTICK OB
Glucose, UA: NEGATIVE
PROTEIN: NEGATIVE

## 2018-12-10 LAB — CREATININE, SERUM: Creatinine, Ser: 0.5 mg/dL (ref 0.44–1.00)

## 2018-12-10 MED ORDER — ACETAMINOPHEN-CODEINE #3 300-30 MG PO TABS: 1.0000 | ORAL_TABLET | Freq: Four times a day (QID) | ORAL | 0 refills | Status: DC | PRN

## 2018-12-10 NOTE — Progress Notes (Signed)
Negative, Released to mychart 

## 2018-12-10 NOTE — Discharge Instructions (Signed)

## 2018-12-10 NOTE — Progress Notes (Signed)
ROB/TDAP/BT consent today- abdominal pain since 6 am this morning, feels like she needs a bowel movement but no, no other symptoms

## 2018-12-10 NOTE — Progress Notes (Signed)
ROB at 31wk5days: presents with complaints of lower abdominal pain since 6 AM today. The pain is constant but worsens intermittently. The pain intensifies for about 5 minutes, then improves x 2 minutes, then worsens again. No dysuria. Has had frequency and nocturia x months. No foul odor to urine. No gross hematuria. Has white discharge but no itching or irritation. + fetal movement. No vaginal bleeding. No history of kidney stones  Exam: General: appears in NAD Abdomen: FH 32/ FHT WNL. Some mild tenderness in lower pole of uterus. No guarding. Uterus soft. No contractions palpated Urine dipstick: 1.005, ph 7, trace leukocytes, large blood, neg nitrite, trace protein, negative glucose. Pelvic exam: Ext /BUS: no lesions or inflammation Vagina: white mucoepithelial discharge Cervix: closed int os/ thick/-3  Wet prep: negative Trich, clue cells, hyphae  A: IUP at 31.5 weeks with intermittent lower abdominal pain Large blood in urine dipstick Differential includes preterm contractions, round ligament pain, UTI, renal stones  P: Sent to L&D for urinalysis with microscopic, urine culture and monitoring for contractions FFN discarded due to recent intercourse. TDAP given/ Blood transfusion consent signed ROB in 2 weeks or sooner pending L&D evaluation  Dalia Heading, CNM

## 2018-12-10 NOTE — Telephone Encounter (Signed)
Pt c/o B-H ctxs since 6am, pretty strong cramps.  [phone]  Explained to pt diff between true ctxs and B-H ctxs.  Pt states it's happening in one area.  It's on the right side beltline, long and steady pain for about 69min then a break for a few minutes, then pain again. Has good FM.  Still has appendix.  Tx'd to [facility] for scheduling.

## 2018-12-10 NOTE — OB Triage Note (Signed)
Abdominal cramping / burning feeling since 0600 this am. Denies vaginal bleeding, LOF. Reports good fetal movement. Last intercourse was yesterday am. Pt also reports lower back pain with the abdominal cramping. Dineen Kid

## 2018-12-10 NOTE — H&P (Signed)
H&P  Joyce Montoya is an 23 y.o. female.  HPI: Sh was sent to the hospital from the office this morning for evaluation of abdominal pain and hematuria. She reports that she has had a sudden onset today of severe abdominal pain. The pain has been constant, but she has had a few periods of relief. She thought she was constipated, but she has had two bowel movements today and the pain has continued. She has not had nausea or vomiting. She had not had vaginal bleeding. No dysuria. No increase or change in urinary frequency. No foul odor to urine. No gross hematuria. Has white discharge but no itching or irritation. + fetal movement. No history of kidney stones. Cervix was check in the office and it was closed.   FIRST Problems (from 08/27/18 to present)    Problem Noted Resolved   Supervision of other normal pregnancy, antepartum 08/27/2018 by Rexene Agent, CNM No   Overview Addendum 12/10/2018 10:49 AM by Dalia Heading, [place] Prenatal Labs  Dating 16wk1d ultrasound Blood type: B/Positive/-- (10/25 1158)   Genetic Screen  NIPS: diploid XY Antibody:Negative (10/25 1158)  Anatomic [place] Normal, anterior placenta Rubella: 1.52 (10/25 1158) Varicella:  Immune  GTT Third trimester: 55 RPR: Non Reactive (10/25 1158)   Rhogam N/A HBsAg: Negative (10/25 1158)   TDaP vaccine                        Flu Shot: HIV: Non Reactive (10/25 1158)   Baby Food     Breast                           GBS:   Contraception  Pap:  CBB     CS/VBAC    Support Person                  Past Medical History:  Diagnosis Date  . Frequent headaches   . History of chlamydia   . Irregular menses   . SAB (spontaneous abortion)   . SAB (spontaneous abortion) 11/2016    Past Surgical History:  Procedure Laterality Date  . HAND SURGERY Right 06/2016  . WISDOM TOOTH EXTRACTION      Family History  Problem Relation Age of Onset  . Headache Mother   . Ovarian cysts Mother   . Headache Sister      Social History:  reports that she has quit smoking. Her smoking use included cigarettes. She smoked 0.25 packs per day. She has never used smokeless tobacco. She reports previous alcohol use. She reports that she does not use drugs.  Allergies: No Known Allergies  Medications: I have reviewed the patient's current medications.  Results for orders placed or performed during the hospital encounter of 12/10/18 (from the past 48 hour(s))  Urinalysis, Routine w reflex microscopic     Status: Abnormal   Collection Time: 12/10/18 12:18 PM  Result Value Ref Range   Color, Urine STRAW (A) YELLOW   APPearance CLEAR (A) CLEAR   Specific Gravity, Urine 1.005 1.005 - 1.030   pH 8.0 5.0 - 8.0   Glucose, UA NEGATIVE NEGATIVE mg/dL   Hgb urine dipstick LARGE (A) NEGATIVE   Bilirubin Urine NEGATIVE NEGATIVE   Ketones, ur NEGATIVE NEGATIVE mg/dL   Protein, ur NEGATIVE NEGATIVE mg/dL   Nitrite NEGATIVE NEGATIVE   Leukocytes, UA NEGATIVE NEGATIVE   RBC / HPF 21-50 0 -  5 RBC/hpf   WBC, UA 0-5 0 - 5 WBC/hpf   Bacteria, UA RARE (A) NONE SEEN   Squamous Epithelial / LPF 0-5 0 - 5    Comment: Performed at [facility], [address] [place], [place] [address]    No results found.  Review of Systems  Constitutional: Negative for chills, fever, malaise/fatigue and weight loss.  HENT: Negative for congestion, hearing loss and sinus pain.   Eyes: Negative for blurred vision and double vision.  Respiratory: Negative for cough, sputum production, shortness of breath and wheezing.   Cardiovascular: Negative for chest pain, palpitations, orthopnea and leg swelling.  Gastrointestinal: Positive for abdominal pain. Negative for constipation, diarrhea, nausea and vomiting.  Genitourinary: Positive for flank pain. Negative for dysuria, frequency, hematuria and urgency.  Musculoskeletal: Negative for back pain, falls and joint pain.  Skin: Negative for itching and rash.  Neurological: Negative for  dizziness and headaches.  Psychiatric/Behavioral: Negative for depression, substance abuse and suicidal ideas. The patient is not nervous/anxious.    Blood pressure 107/67, pulse 95, temperature 97.7 F (36.5 C), temperature source Oral, resp. rate 16, height 5\' 1"  (1.549 m), weight 61.7 kg, last menstrual period 07/29/2018. Physical Exam  Nursing note and vitals reviewed. Constitutional: She is oriented to person, place, and time. She appears well-developed and well-nourished.  HENT:  Head: Normocephalic and atraumatic.  Cardiovascular: Normal rate and regular rhythm.  Respiratory: Effort normal and breath sounds normal.  GI: Soft. Bowel sounds are normal. She exhibits no distension. There is no abdominal tenderness. There is no rebound and no guarding.  Musculoskeletal: Normal range of motion.  Neurological: She is alert and oriented to person, place, and time.  Skin: Skin is warm and dry.  Psychiatric: She has a normal mood and affect. Her behavior is normal. Judgment and thought content normal.   NST: 140 bpm baseline, moderate variability, 15x15 accelerations, no decelerations. Tocometer : quiet   Assessment/Plan: 23 yo  With right sided abdominal and flank pain Renal US showing severe right sided hydronephrosis- likely kidney stone given pain and hematuria- encouraged oral hydration. Will send with rx for Tylenol #3 as needed. Asked patient to return to the ER if pain is severe and not controlled by pain medication. Discussed with Dr. Diamantina [facility] of Urology who recommended follow up in 2 weeks in their office and a postpartum [place] one week after her delivery to evaluate for resolution of the hydronephrosis.    Sable Knoles R Derrin Currey 12/10/2018, 1:46 PM

## 2018-12-10 NOTE — OB Triage Note (Signed)
Discharge home, ambulatory with her father. Discharge instructions reviewed. Questions answered. Dineen Kid

## 2018-12-10 NOTE — Discharge Summary (Signed)
Physician Discharge Summary   Patient ID: Joyce Montoya [id number] 23 y.o. 02/11/1996  Admit date: 12/10/2018  Discharge date and time: 12/10/2018  4:40 PM   Admitting Physician: Homero Fellers, MD   Discharge Physician: Adrian Prows MD  Admission Diagnoses: 32wks/bleeding   Discharge Diagnoses: Kidney stone in pregnancy  Admission Condition: good  Discharged Condition: good  Indication for Admission: Evaluation of right sided pain  Hospital Course: Please see H&P  Consults: None  Significant Diagnostic Studies: radiology: Ultrasound: right sided severe hydronephrosis  Treatments: none  Discharge Exam: BP 107/67 (BP Location: Left Arm)   Pulse 95   Temp 97.7 F (36.5 C) (Oral)   Resp 16   Ht 5\' 1"  (1.549 m)   Wt 61.7 kg   LMP 07/29/2018 (Exact Date)   BMI 25.70 kg/m   General Appearance:    Alert, cooperative, no distress, appears stated age  Head:    Normocephalic, without obvious abnormality, atraumatic  Eyes:    PERRL, conjunctiva/corneas clear, EOM's intact, fundi    benign, both eyes  Ears:    Normal TM's and external ear canals, both ears  Nose:   Nares normal, septum midline, mucosa normal, no drainage    or sinus tenderness  Throat:   Lips, mucosa, and tongue normal; teeth and gums normal  Neck:   Supple, symmetrical, trachea midline, no adenopathy;    thyroid:  no enlargement/tenderness/nodules; no carotid   bruit or JVD  Back:     Symmetric, no curvature, ROM normal, no CVA tenderness  Lungs:     Clear to auscultation bilaterally, respirations unlabored  Chest Wall:    No tenderness or deformity   Heart:    Regular rate and rhythm, S1 and S2 normal, no murmur, rub   or gallop  Breast Exam:    No tenderness, masses, or nipple abnormality  Abdomen:     Soft, non-tender, bowel sounds active all four quadrants,    no masses, no organomegaly  Genitalia:    Normal female without lesion, discharge or tenderness  Rectal:    Normal tone, normal  prostate, no masses or tenderness;   guaiac negative stool  Extremities:   Extremities normal, atraumatic, no cyanosis or edema  Pulses:   2+ and symmetric all extremities  Skin:   Skin color, texture, turgor normal, no rashes or lesions  Lymph nodes:   Cervical, supraclavicular, and axillary nodes normal  Neurologic:   CNII-XII intact, normal strength, sensation and reflexes    throughout    Disposition: Discharge disposition: 01-Home or Self Care       Patient Instructions:  Allergies as of 12/10/2018   No Known Allergies     Medication List    TAKE these medications   acetaminophen-codeine 300-30 MG tablet Commonly known as:  TYLENOL #3 Take 1 tablet by mouth every 6 (six) hours as needed for moderate pain.      Activity: activity as tolerated Diet: regular diet and encourage fluids Wound Care: none needed  Follow-up with [facility]  in 2 weeks.  Signed: Homero Fellers 12/10/2018 6:44 PM

## 2018-12-11 LAB — URINE CULTURE: Culture: NO GROWTH

## 2018-12-13 NOTE — Progress Notes (Signed)
Negative, Released to mychart 

## 2018-12-14 ENCOUNTER — Telehealth: Payer: Self-pay | Admitting: Urology

## 2018-12-14 NOTE — Care Management Important Message (Signed)
Important Message  Patient Details  Name: Joyce Montoya MRN: [id number] Date of Birth: [birth year]   Medicare Important Message Given:  Yes    [address] 12/14/2018, 4:17 PM

## 2018-12-14 NOTE — Telephone Encounter (Signed)
Patient called the office requesting a follow up appointment.  She is [redacted] weeks pregnant and was recently in the ER for a kidney stone.    I added her to your schedule on 12/23/18.

## 2018-12-23 ENCOUNTER — Ambulatory Visit: Payer: Self-pay | Admitting: Urology

## 2018-12-27 ENCOUNTER — Encounter: Payer: 59 | Admitting: Obstetrics and Gynecology

## 2018-12-28 ENCOUNTER — Encounter: Payer: Self-pay | Admitting: Urology

## 2018-12-28 ENCOUNTER — Encounter: Payer: Self-pay | Admitting: Advanced Practice Midwife

## 2018-12-28 ENCOUNTER — Ambulatory Visit: Payer: Self-pay | Admitting: Urology

## 2018-12-28 ENCOUNTER — Ambulatory Visit (INDEPENDENT_AMBULATORY_CARE_PROVIDER_SITE_OTHER): Payer: 59 | Admitting: Advanced Practice Midwife

## 2018-12-28 VITALS — BP 114/66 | Wt 140.0 lb

## 2018-12-28 DIAGNOSIS — Z3A34 34 weeks gestation of pregnancy: Secondary | ICD-10-CM

## 2018-12-28 LAB — POCT URINALYSIS DIPSTICK OB
Glucose, UA: NEGATIVE
PROTEIN: NEGATIVE

## 2018-12-28 NOTE — Progress Notes (Signed)
ROB

## 2018-12-28 NOTE — Patient Instructions (Signed)

## 2018-12-28 NOTE — Progress Notes (Signed)
  Routine Prenatal Care Visit  Subjective  Joyce Montoya is a 23 y.o. G3P0020 at [redacted]w[redacted]d being seen today for ongoing prenatal care.  She is currently monitored for the following issues for this low-risk pregnancy and has Other fatigue; Frequent headaches; Seasonal allergies; Abdominal pain in female patient; Chronic diarrhea; Chronic sore throat; Loss of appetite for more than 2 weeks; Menorrhagia with irregular cycle; Depression screening; Supervision of other normal pregnancy, antepartum; and [redacted] weeks gestation of pregnancy on their problem list.  ----------------------------------------------------------------------------------- Patient reports no complaints.   Contractions: Not present. Vag. Bleeding: None.  Movement: Present. Denies leaking of fluid.  ----------------------------------------------------------------------------------- The following portions of the patient's history were reviewed and updated as appropriate: allergies, current medications, past family history, past medical history, past social history, past surgical history and problem list. Problem list updated.   Objective  Blood pressure 114/66, weight 140 lb (63.5 kg), last menstrual period 07/29/2018. Pregravid weight 115 lb (52.2 kg) Total Weight Gain 25 lb (11.3 kg) Urinalysis: Urine Protein Negative  Urine Glucose Negative  Fetal Status: Fetal Heart Rate (bpm): 140 Fundal Height: 35 cm Movement: Present     General:  Alert, oriented and cooperative. Patient is in no acute distress.  Skin: Skin is warm and dry. No rash noted.   Cardiovascular: Normal heart rate noted  Respiratory: Normal respiratory effort, no problems with respiration noted  Abdomen: Soft, gravid, appropriate for gestational age. Pain/Pressure: Absent     Pelvic:  Cervical exam deferred        Extremities: Normal range of motion.  Edema: None  Mental Status: Normal mood and affect. Normal behavior. Normal judgment and thought content.    Assessment   23 y.o. G3P0020 at [redacted]w[redacted]d by  02/06/2019, by Ultrasound presenting for routine prenatal visit  Plan   FIRST Problems (from 08/27/18 to present)    Problem Noted Resolved   Supervision of other normal pregnancy, antepartum 08/27/2018 by Rexene Agent, CNM No   Overview Addendum 12/10/2018 10:49 AM by Dalia Heading, [place] Prenatal Labs  Dating 16wk1d ultrasound Blood type: B/Positive/-- (10/25 1158)   Genetic Screen  NIPS: diploid XY Antibody:Negative (10/25 1158)  Anatomic [place] Normal, anterior placenta Rubella: 1.52 (10/25 1158) Varicella:  Immune  GTT Early:               Third trimester: 39 RPR: Non Reactive (10/25 1158)   Rhogam N/A HBsAg: Negative (10/25 1158)   TDaP vaccine                        Flu Shot: HIV: Non Reactive (10/25 1158)   Baby Food     Breast                           GBS:   Contraception  Pap:  CBB     CS/VBAC    Support Person                  Preterm labor symptoms and general obstetric precautions including but not limited to vaginal bleeding, contractions, leaking of fluid and fetal movement were reviewed in detail with the patient.  Please refer to After Visit Summary for other counseling recommendations.   Return in about 2 weeks (around 01/11/2019) for rob.  Rod Can, CNM 12/28/2018 3:29 PM

## 2019-01-11 ENCOUNTER — Other Ambulatory Visit (HOSPITAL_COMMUNITY)
Admission: RE | Admit: 2019-01-11 | Discharge: 2019-01-11 | Disposition: A | Discharge status: Home / Self Care | Payer: Medicaid Other | Source: Ambulatory Visit | Attending: Obstetrics and Gynecology | Admitting: Obstetrics and Gynecology

## 2019-01-11 ENCOUNTER — Encounter: Payer: Self-pay | Admitting: Obstetrics and Gynecology

## 2019-01-11 ENCOUNTER — Ambulatory Visit (INDEPENDENT_AMBULATORY_CARE_PROVIDER_SITE_OTHER): Payer: 59 | Admitting: Obstetrics and Gynecology

## 2019-01-11 VITALS — BP 110/64 | Wt 144.0 lb

## 2019-01-11 DIAGNOSIS — Z3483 Encounter for supervision of other normal pregnancy, third trimester: Secondary | ICD-10-CM

## 2019-01-11 DIAGNOSIS — Z3480 Encounter for supervision of other normal pregnancy, unspecified trimester: Secondary | ICD-10-CM

## 2019-01-11 DIAGNOSIS — Z3A36 36 weeks gestation of pregnancy: Secondary | ICD-10-CM

## 2019-01-11 NOTE — Progress Notes (Signed)
  Routine Prenatal Care Visit  Subjective  Joyce Montoya is a 23 y.o. G3P0020 at [redacted]w[redacted]d being seen today for ongoing prenatal care.  She is currently monitored for the following issues for this low-risk pregnancy and has Other fatigue; Frequent headaches; Seasonal allergies; Abdominal pain in female patient; Chronic diarrhea; Chronic sore throat; Loss of appetite for more than 2 weeks; Menorrhagia with irregular cycle; Depression screening; and Supervision of other normal pregnancy, antepartum on their problem list.  ----------------------------------------------------------------------------------- Patient reports no complaints.   Contractions: Not present. Vag. Bleeding: None.  Movement: Present. Denies leaking of fluid.  ----------------------------------------------------------------------------------- The following portions of the patient's history were reviewed and updated as appropriate: allergies, current medications, past family history, past medical history, past social history, past surgical history and problem list. Problem list updated.   Objective  Blood pressure 110/64, weight 144 lb (65.3 kg), last menstrual period 07/29/2018. Pregravid weight 115 lb (52.2 kg) Total Weight Gain 29 lb (13.2 kg) Urinalysis: Urine Protein    Urine Glucose    Fetal Status: Fetal Heart Rate (bpm): 140 Fundal Height: 36 cm Movement: Present  Presentation: Vertex  General:  Alert, oriented and cooperative. Patient is in no acute distress.  Skin: Skin is warm and dry. No rash noted.   Cardiovascular: Normal heart rate noted  Respiratory: Normal respiratory effort, no problems with respiration noted  Abdomen: Soft, gravid, appropriate for gestational age. Pain/Pressure: Absent     Pelvic:  Cervical exam deferred Dilation: 1 Effacement (%): 50 Station: -3  Extremities: Normal range of motion.  Edema: None  Mental Status: Normal Montoya and affect. Normal behavior. Normal judgment and thought content.    Assessment   23 y.o. G3P0020 at [redacted]w[redacted]d by  02/06/2019, by Ultrasound presenting for routine prenatal visit  Plan   FIRST Problems (from 08/27/18 to present)    Problem Noted Resolved   Supervision of other normal pregnancy, antepartum 08/27/2018 by Rexene Agent, CNM No   Overview Addendum 12/10/2018 10:49 AM by Dalia Heading, [facility] Prenatal Labs  Dating 16wk1d ultrasound Blood type: B/Positive/-- (10/25 1158)   Genetic Screen  NIPS: diploid XY Antibody:Negative (10/25 1158)  Anatomic [place] Normal, anterior placenta Rubella: 1.52 (10/25 1158) Varicella:  Immune  GTT Early:               Third trimester: 41 RPR: Non Reactive (10/25 1158)   Rhogam N/A HBsAg: Negative (10/25 1158)   TDaP vaccine                        Flu Shot: HIV: Non Reactive (10/25 1158)   Baby Food     Breast                           GBS:   Contraception  Pap:  CBB     CS/VBAC    Support Person                  Preterm labor symptoms and general obstetric precautions including but not limited to vaginal bleeding, contractions, leaking of fluid and fetal movement were reviewed in detail with the patient. Please refer to After Visit Summary for other counseling recommendations.   - GBS/Aptima today  Return in about 1 week (around 01/18/2019) for Routine Prenatal Appointment.  Prentice Docker, MD, [facility], [facility] 01/11/2019 2:48 PM

## 2019-01-12 LAB — CERVICOVAGINAL ANCILLARY ONLY
Chlamydia: NEGATIVE
Neisseria Gonorrhea: NEGATIVE

## 2019-01-13 LAB — STREP GP B NAA: Strep Gp B NAA: NEGATIVE

## 2019-01-17 ENCOUNTER — Telehealth: Payer: Self-pay

## 2019-01-17 NOTE — Telephone Encounter (Signed)
Pt calling to see what's going on c the hospital c covid-19 and all.  [phone]  Adv no children, immediate family and probably only allowed.  May call L&D for more information.

## 2019-01-20 ENCOUNTER — Ambulatory Visit (INDEPENDENT_AMBULATORY_CARE_PROVIDER_SITE_OTHER): Payer: 59 | Admitting: Obstetrics and Gynecology

## 2019-01-20 ENCOUNTER — Other Ambulatory Visit: Payer: Self-pay

## 2019-01-20 ENCOUNTER — Encounter: Payer: Self-pay | Admitting: Obstetrics and Gynecology

## 2019-01-20 VITALS — BP 116/74 | Wt 143.0 lb

## 2019-01-20 DIAGNOSIS — Z3483 Encounter for supervision of other normal pregnancy, third trimester: Secondary | ICD-10-CM

## 2019-01-20 DIAGNOSIS — Z3480 Encounter for supervision of other normal pregnancy, unspecified trimester: Secondary | ICD-10-CM

## 2019-01-20 DIAGNOSIS — Z3A37 37 weeks gestation of pregnancy: Secondary | ICD-10-CM

## 2019-01-20 NOTE — Progress Notes (Signed)
  Routine Prenatal Care Visit  Subjective  Joyce Montoya is a 23 y.o. G3P0020 at [redacted]w[redacted]d being seen today for ongoing prenatal care.  She is currently monitored for the following issues for this low-risk pregnancy and has Other fatigue; Frequent headaches; Seasonal allergies; Abdominal pain in female patient; Chronic diarrhea; Chronic sore throat; Loss of appetite for more than 2 weeks; Menorrhagia with irregular cycle; Depression screening; and Supervision of other normal pregnancy, antepartum on their problem list.  ----------------------------------------------------------------------------------- Patient reports no complaints.   Contractions: Not present. Vag. Bleeding: None.  Movement: Present. Denies leaking of fluid.  ----------------------------------------------------------------------------------- The following portions of the patient's history were reviewed and updated as appropriate: allergies, current medications, past family history, past medical history, past social history, past surgical history and problem list. Problem list updated.   Objective  Blood pressure 116/74, weight 143 lb (64.9 kg), last menstrual period 07/29/2018. Pregravid weight 115 lb (52.2 kg) Total Weight Gain 28 lb (12.7 kg) Urinalysis: Urine Protein    Urine Glucose    Fetal Status: Fetal Heart Rate (bpm): 125 Fundal Height: 37 cm Movement: Present  Presentation: Vertex  General:  Alert, oriented and cooperative. Patient is in no acute distress.  Skin: Skin is warm and dry. No rash noted.   Cardiovascular: Normal heart rate noted  Respiratory: Normal respiratory effort, no problems with respiration noted  Abdomen: Soft, gravid, appropriate for gestational age. Pain/Pressure: Absent     Pelvic:  Cervical exam performed Dilation: 1.5 Effacement (%): 50 Station: -3  Extremities: Normal range of motion.  Edema: None  Mental Status: Normal mood and affect. Normal behavior. Normal judgment and thought content.    Assessment   23 y.o. G3P0020 at [redacted]w[redacted]d by  02/06/2019, by Ultrasound presenting for routine prenatal visit  Plan   FIRST Problems (from 08/27/18 to present)    Problem Noted Resolved   Supervision of other normal pregnancy, antepartum 08/27/2018 by Rexene Agent, CNM No   Overview Addendum 01/13/2019  1:40 PM by Will Bonnet, MD    Clinic [facility] Prenatal Labs  Dating 16wk1d ultrasound Blood type: B/Positive/-- (10/25 1158)   Genetic Screen  NIPS: diploid XY Antibody:Negative (10/25 1158)  Anatomic [place] Normal, anterior placenta Rubella: 1.52 (10/25 1158) Varicella:  Immune  GTT Early:               Third trimester: 19 RPR: Non Reactive (10/25 1158)   Rhogam N/A HBsAg: Negative (10/25 1158)   TDaP vaccine                        Flu Shot: HIV: Non Reactive (10/25 1158)   Baby Food     Breast                           GBS: Negative 3/10  Contraception  Pap:  CBB     CS/VBAC    Support Person                  Term labor symptoms and general obstetric precautions including but not limited to vaginal bleeding, contractions, leaking of fluid and fetal movement were reviewed in detail with the patient. Please refer to After Visit Summary for other counseling recommendations.   Return in about 1 week (around 01/27/2019) for Routine Prenatal Appointment.  Prentice Docker, MD, [facility], [facility] 01/20/2019 8:46 AM

## 2019-01-20 NOTE — Patient Instructions (Signed)
Coronavirus (COVID-19) Are you at risk?  Are you at risk for the Coronavirus (COVID-19)?  To be considered HIGH RISK for Coronavirus (COVID-19), you have to meet the following criteria:  . Traveled to [place], [place], [place], [place] or [place]; or in the [place] to [place], [place], [place], or [place]; and have fever, cough, and shortness of breath within the last 2 weeks of travel OR . Been in close contact with a person diagnosed with COVID-19 within the last 2 weeks and have fever, cough, and shortness of breath . IF YOU DO NOT MEET THESE CRITERIA, YOU ARE CONSIDERED LOW RISK FOR COVID-19.  What to do if you are HIGH RISK for COVID-19?  . If you are having a medical emergency, call 911. . Seek medical care right away. Before you go to a doctor's office, urgent care or emergency department, call ahead and tell them about your recent travel, contact with someone diagnosed with COVID-19, and your symptoms. You should receive instructions from your physician's office regarding next steps of care.  . When you arrive at healthcare provider, tell the healthcare staff immediately you have returned from visiting [place], [place], [place], [place] or [place]; or traveled in the [place] to [place], [place], [place], or [place]; in the last two weeks or you have been in close contact with a person diagnosed with COVID-19 in the last 2 weeks.   . Tell the health care staff about your symptoms: fever, cough and shortness of breath. . After you have been seen by a medical provider, you will be either: o Tested for (COVID-19) and discharged home on quarantine except to seek medical care if symptoms worsen, and asked to  - Stay home and avoid contact with others until you get your results (4-5 days)  - Avoid travel on public transportation if possible (such as bus, train, or airplane) or o Sent to the Emergency Department by EMS for evaluation, COVID-19 testing, and possible  admission depending on your condition and test results.  What to do if you are LOW RISK for COVID-19?  Reduce your risk of any infection by using the same precautions used for avoiding the common cold or flu:  . Wash your hands often with soap and warm water for at least 20 seconds.  If soap and water are not readily available, use an alcohol-based hand sanitizer with at least 60% alcohol.  . If coughing or sneezing, cover your mouth and nose by coughing or sneezing into the elbow areas of your shirt or coat, into a tissue or into your sleeve (not your hands). . Avoid shaking hands with others and consider head nods or verbal greetings only. . Avoid touching your eyes, nose, or mouth with unwashed hands.  . Avoid close contact with people who are sick. . Avoid places or events with large numbers of people in one location, like concerts or sporting events. . Carefully consider travel plans you have or are making. . If you are planning any travel outside or inside the [place], visit the CDC's Travelers' Health webpage for the latest health notices. . If you have some symptoms but not all symptoms, continue to monitor at home and seek medical attention if your symptoms worsen. . If you are having a medical emergency, call 911.   ADDITIONAL HEALTHCARE OPTIONS FOR PATIENTS  [facility] / e-Visit: [email or web address]         [facility]: [phone]  [facility]: [phone]                   [facility]: [phone]   

## 2019-01-25 ENCOUNTER — Encounter: Payer: Self-pay | Admitting: Obstetrics and Gynecology

## 2019-01-25 ENCOUNTER — Ambulatory Visit (INDEPENDENT_AMBULATORY_CARE_PROVIDER_SITE_OTHER): Payer: 59 | Admitting: Obstetrics and Gynecology

## 2019-01-25 ENCOUNTER — Other Ambulatory Visit: Payer: Self-pay

## 2019-01-25 VITALS — BP 114/70 | Wt 142.0 lb

## 2019-01-25 DIAGNOSIS — Z3480 Encounter for supervision of other normal pregnancy, unspecified trimester: Secondary | ICD-10-CM

## 2019-01-25 DIAGNOSIS — Z3A38 38 weeks gestation of pregnancy: Secondary | ICD-10-CM

## 2019-01-25 DIAGNOSIS — Z3483 Encounter for supervision of other normal pregnancy, third trimester: Secondary | ICD-10-CM

## 2019-01-25 NOTE — Progress Notes (Signed)
  [facility] BIRTH[facility] INDUCTION ASSESSMENT SCHEDULING Joyce Montoya [birth year] Medical record #: [id number] Phone #:  Home Phone [phone]  Mobile [phone]    [facility]renatal Provider:[facility]ivering Group[facility] Proposed admission date/time:01/31/2019 @ 0500 Method of induction:Cytotec  Weight: Filed Weights03/24/20 0850Weight:142 lb (64.4 kg) BMI Body mass index is 26.83 kg/m. HIV Negative HSV Negative EDC Estimated Date of Delivery: 4/5/20based on:US at [redacted] wks  Gestational age on admission: [redacted]w[redacted]d Gravidity/parity:G3P0020  Cervix Score   0 1 2 3   Position Posterior Midposition Anterior   Consistency Firm Medium Soft   Effacement (%) 0-30 40-50 60-70 >80  Dilation (cm) Closed 1-2 3-4 >5  Baby's station -3 -2 -1 +1, +2   Bishop Score:4   select indication(s) below Elective induction ?39 weeks primiparous patient   Medical Indications Adapted from [facility] #560, "Medically Indicated Late Preterm and Early Term Deliveries," 2013.  PLACENTAL / UTERINE ISSUES FETAL ISSUES MATERNAL ISSUES  ? 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 Isoimmunization (37.0-38.6) ? Preeclampsia without severe features or gestational HTN (37.0)  ? Suspected accreta (34.0-35.6) ? Growth Restriction Nelda [place]se[place]) ? Preeclampsia with severe features (34.0)  ? Prior classical CD, uterine window, rupture (36.0-37.6) ? Isolated (38.0-39.6) ? Chronic HTN (38.0-39.6)  ? Prior myomectomy (37.0-38.6) ? Concurrent findings (34.0-37.6) ? 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 Growth Restriction (Twins) ? Diabetes  ? Placental abruption (chronic) ? Di-Di Isolated (36.0-37.6) ? Pregestational, controlled (39.0)  OBSTETRIC ISSUES ? Di-Di concurrent findings (32.0-34.6) ? Pregestational, uncontrolled (37.0-39.0)  ? Postdates ? (41 weeks) ? Mo-Di isolated (32.0-34.6) ? Pregestational, vascular compromise (37.0- 39.0)  ? PPROM (34.0) ? Multiple Gestation ? Gestational, diet controlled  (40.0)  ? Hx of IUFD (39.0 weeks) ? Di-Di (38.0-38.6) ? Gestational, med controlled (39.0)  ? Polyhydramnios, mild/moderate; SDV 8-16 or AFI 25-35 (39.0) ? Mo-Di (36.0-37.6) ? Gestational, uncontrolled (38.0-39.0)  ? Oligohydramnios (36.0-37.6); MVP <2 cm  For indications not listed above, delivery recommendations from maternal-fetal medicine consultant occurred on: Date:n/a with Dr. Durward Parcel for indication of:n/a  Provider Signature: Prentice Docker Scheduled WS:[facility] Lorin Mercy, [place] Date:01/25/2019 9:44 AM   Call [phone] to finalize the induction date/time  [id number] (07/17)

## 2019-01-25 NOTE — Progress Notes (Signed)
OB History & Physical   History of Present Illness:  Chief Complaint: Induction of labor  HPI:  Joyce Montoya is a 23 y.o. G42P0020 female at [redacted]w[redacted]d dated by 16 week ultrasound.  Her pregnancy has been uncomplicated.    She reports contractions.   She denies leakage of fluid.   She denies vaginal bleeding.   She reports fetal movement.    Maternal Medical History:   Past Medical History:  Diagnosis Date  . Frequent headaches   . History of chlamydia   . Irregular menses   . SAB (spontaneous abortion)   . SAB (spontaneous abortion) 11/2016    Past Surgical History:  Procedure Laterality Date  . HAND SURGERY Right 06/2016  . WISDOM TOOTH EXTRACTION      No Known Allergies  Prior to Admission medications   Medication Sig Start Date End Date Taking? Authorizing Provider  acetaminophen-codeine (TYLENOL #3) 300-30 MG tablet Take 1 tablet by mouth every 6 (six) hours as needed for moderate pain. Patient not taking: Reported on 12/28/2018 12/10/18   Homero Fellers, MD    OB History  Gravida Para Term Preterm AB Living  3       2    SAB TAB Ectopic Multiple Live Births  1 1          # Outcome Date GA Lbr Len/2nd Weight Sex Delivery Anes PTL Lv  3 Current           2 SAB 11/2016          1 TAB             Prenatal care site: [facility]  Social History: She  reports that she has quit smoking. Her smoking use included cigarettes. She smoked 0.25 packs per day. She has never used smokeless tobacco. She reports previous alcohol use. She reports that she does not use drugs.  Family History: family history includes Headache in her mother and sister; Ovarian cysts in her mother.   Review of Systems: Negative x 10 systems reviewed except as noted in the HPI.    Physical Exam:  Vital Signs: BP 114/70   Wt 142 lb (64.4 kg)   LMP 07/29/2018 (Exact Date)   BMI 26.83 kg/m  Constitutional: Well nourished, well developed female in no acute distress.  HEENT: normal Skin:  Warm and dry.  Cardiovascular: Regular rate and rhythm.   Extremity: no edema  Respiratory: Clear to auscultation bilateral. Normal respiratory effort Abdomen: FHT present and gravid/NT Back: no CVAT Neuro: DTRs 2+, Cranial nerves grossly intact Psych: Alert and Oriented x3. No memory deficits. Normal mood and affect.  MS: normal gait, normal bilateral lower extremity ROM/strength/stability.  Pelvic exam: (female chaperone present) is not limited by body habitus Cervix: 1.5/50/-3/soft/mid  Pertinent Results:  Prenatal Labs: Blood type/Rh B positive  Antibody screen negative  Rubella Immune  Varicella Immune    RPR NR  HBsAg negative  HIV negative  GC negative  Chlamydia negative  Genetic screening Diploid XY, msAFP not done  1 hour GTT 55  3 hour GTT n/a  GBS negative on 3/10   Bedside Ultrasound:  Number of Fetus: 1  Presentation: cephalic  Fluid: subjectively normal  Assessment:  Joyce Montoya is a 23 y.o. G44P0020 female at [redacted]w[redacted]d with IOL on 01/31/2019.   Plan:  1. Admit to Labor & Delivery  2. CBC, T&S, Clrs, IVF 3. GBS negative.   4. Risks and benefits of IOL discussed. 5. If  no cervical change, then will start with cytotec +/- Foley balloon.   Prentice Docker, MD 01/25/2019 9:46 AM

## 2019-01-25 NOTE — Progress Notes (Signed)
  Routine Prenatal Care Visit  Subjective  Joyce Montoya is a 23 y.o. G3P0020 at [redacted]w[redacted]d being seen today for ongoing prenatal care.  She is currently monitored for the following issues for this low-risk pregnancy and has Other fatigue; Frequent headaches; Seasonal allergies; Abdominal pain in female patient; Chronic diarrhea; Chronic sore throat; Loss of appetite for more than 2 weeks; Menorrhagia with irregular cycle; Depression screening; and Supervision of other normal pregnancy, antepartum on their problem list.  ----------------------------------------------------------------------------------- Patient reports no complaints.   Contractions: Irregular. Vag. Bleeding: None.  Movement: Present. Denies leaking of fluid.  ----------------------------------------------------------------------------------- The following portions of the patient's history were reviewed and updated as appropriate: allergies, current medications, past family history, past medical history, past social history, past surgical history and problem list. Problem list updated.   Objective  Blood pressure 114/70, weight 142 lb (64.4 kg), last menstrual period 07/29/2018. Pregravid weight 115 lb (52.2 kg) Total Weight Gain 27 lb (12.2 kg) Urinalysis: Urine Protein    Urine Glucose    Fetal Status: Fetal Heart Rate (bpm): 150 Fundal Height: 39 cm Movement: Present  Presentation: Vertex  General:  Alert, oriented and cooperative. Patient is in no acute distress.  Skin: Skin is warm and dry. No rash noted.   Cardiovascular: Normal heart rate noted  Respiratory: Normal respiratory effort, no problems with respiration noted  Abdomen: Soft, gravid, appropriate for gestational age. Pain/Pressure: Absent     Pelvic:  Cervical exam performed Dilation: 1.5 Effacement (%): 50 Station: -3  Extremities: Normal range of motion.  Edema: None  Mental Status: Normal mood and affect. Normal behavior. Normal judgment and thought content.    Assessment   23 y.o. G3P0020 at [redacted]w[redacted]d by  02/06/2019, by Ultrasound presenting for routine prenatal visit  Plan   FIRST Problems (from 08/27/18 to present)    Problem Noted Resolved   Supervision of other normal pregnancy, antepartum 08/27/2018 by Rexene Agent, CNM No   Overview Addendum 01/13/2019  1:40 PM by Will Bonnet, MD    [facility]inic [facility] Prenatal Labs  Dating 16wk1d ultrasound Blood type: B/Positive/-- (10/25 1158)   Genetic Screen  NIPS: diploid XY Antibody:Negative (10/25 1158)  Anatomic [place] Normal, anterior placenta Rubella: 1.52 (10/25 1158) Varicella:  Immune  GTT Early:               Third trimester: 43 RPR: Non Reactive (10/25 1158)   Rhogam N/A HBsAg: Negative (10/25 1158)   TDaP vaccine                        Flu Shot: HIV: Non Reactive (10/25 1158)   Baby Food     Breast                           GBS: Negative 3/10  Contraception  Pap:  CBB     CS/VBAC    Support Person                  Term labor symptoms and general obstetric precautions including but not limited to vaginal bleeding, contractions, leaking of fluid and fetal movement were reviewed in detail with the patient. Please refer to After Visit Summary for other counseling recommendations.   - IOL 3/30 @ 0500  Return if symptoms worsen or fail to improve.  Prentice Docker, MD, [facility], [facility] 01/25/2019 9:44 AM

## 2019-01-26 ENCOUNTER — Encounter: Payer: 59 | Admitting: Obstetrics and Gynecology

## 2019-01-31 ENCOUNTER — Inpatient Hospital Stay: Payer: Medicaid Other | Admitting: Anesthesiology

## 2019-01-31 ENCOUNTER — Other Ambulatory Visit: Payer: Self-pay

## 2019-01-31 ENCOUNTER — Inpatient Hospital Stay
Admission: EM | Admit: 2019-01-31 | Discharge: 2019-02-02 | DRG: 807 | Disposition: A | Discharge status: Home / Self Care | Payer: Medicaid Other | Attending: Maternal Newborn | Admitting: Maternal Newborn

## 2019-01-31 DIAGNOSIS — Z3A39 39 weeks gestation of pregnancy: Secondary | ICD-10-CM

## 2019-01-31 DIAGNOSIS — Z87891 Personal history of nicotine dependence: Secondary | ICD-10-CM

## 2019-01-31 DIAGNOSIS — Z789 Other specified health status: Secondary | ICD-10-CM

## 2019-01-31 DIAGNOSIS — O26893 Other specified pregnancy related conditions, third trimester: Secondary | ICD-10-CM | POA: Diagnosis present

## 2019-01-31 DIAGNOSIS — Z3490 Encounter for supervision of normal pregnancy, unspecified, unspecified trimester: Secondary | ICD-10-CM

## 2019-01-31 DIAGNOSIS — Z3480 Encounter for supervision of other normal pregnancy, unspecified trimester: Secondary | ICD-10-CM

## 2019-01-31 LAB — CBC
HCT: 30.5 % — ABNORMAL LOW (ref 36.0–46.0)
HEMOGLOBIN: 10.4 g/dL — AB (ref 12.0–15.0)
MCH: 30.8 pg (ref 26.0–34.0)
MCHC: 34.1 g/dL (ref 30.0–36.0)
MCV: 90.2 fL (ref 80.0–100.0)
Platelets: 156 10*3/uL (ref 150–400)
RBC: 3.38 MIL/uL — ABNORMAL LOW (ref 3.87–5.11)
RDW: 12.5 % (ref 11.5–15.5)
WBC: 5.6 10*3/uL (ref 4.0–10.5)
nRBC: 0 % (ref 0.0–0.2)

## 2019-01-31 LAB — TYPE AND SCREEN
ABO/RH(D): B POS
Antibody Screen: NEGATIVE

## 2019-01-31 MED ORDER — ONDANSETRON HCL 4 MG/2ML IJ SOLN: 4.0000 mg | INTRAMUSCULAR | Status: DC | PRN

## 2019-01-31 MED ORDER — FENTANYL 2.5 MCG/ML W/ROPIVACAINE 0.15% IN NS 100 ML EPIDURAL (ARMC)
12.0000 mL/h | EPIDURAL | Status: DC
  Administered 2019-01-31: 12 mL/h via EPIDURAL

## 2019-01-31 MED ORDER — ONDANSETRON HCL 4 MG PO TABS: 4.0000 mg | ORAL_TABLET | ORAL | Status: DC | PRN

## 2019-01-31 MED ORDER — ONDANSETRON HCL 4 MG/2ML IJ SOLN: 4.0000 mg | Freq: Four times a day (QID) | INTRAMUSCULAR | Status: DC | PRN

## 2019-01-31 MED ORDER — OXYTOCIN 40 UNITS IN NORMAL SALINE INFUSION - SIMPLE MED: 2.5000 [IU]/h | INTRAVENOUS | Status: DC

## 2019-01-31 MED ORDER — IBUPROFEN 600 MG PO TABS
600.0000 mg | ORAL_TABLET | Freq: Four times a day (QID) | ORAL | Status: DC
  Administered 2019-01-31 – 2019-02-02 (×7): 600 mg via ORAL
  Filled 2019-01-31 (×7): qty 1

## 2019-01-31 MED ORDER — COCONUT OIL OIL
1.0000 "application " | TOPICAL_OIL | Status: DC | PRN
  Administered 2019-02-01: 1 via TOPICAL
  Filled 2019-01-31: qty 120

## 2019-01-31 MED ORDER — LACTATED RINGERS IV SOLN
500.0000 mL | INTRAVENOUS | Status: DC | PRN
  Administered 2019-01-31: 500 mL via INTRAVENOUS

## 2019-01-31 MED ORDER — LIDOCAINE HCL (PF) 1 % IJ SOLN: 30.0000 mL | INTRAMUSCULAR | Status: DC | PRN

## 2019-01-31 MED ORDER — TERBUTALINE SULFATE 1 MG/ML IJ SOLN: 0.2500 mg | Freq: Once | INTRAMUSCULAR | Status: DC | PRN

## 2019-01-31 MED ORDER — OXYTOCIN BOLUS FROM INFUSION
500.0000 mL | Freq: Once | INTRAVENOUS | Status: AC
  Administered 2019-01-31: 500 mL via INTRAVENOUS

## 2019-01-31 MED ORDER — MISOPROSTOL 200 MCG PO TABS
ORAL_TABLET | ORAL | Status: AC
  Filled 2019-01-31: qty 4

## 2019-01-31 MED ORDER — MISOPROSTOL 25 MCG QUARTER TABLET: 25.0000 ug | ORAL_TABLET | ORAL | Status: DC | PRN

## 2019-01-31 MED ORDER — BENZOCAINE-MENTHOL 20-0.5 % EX AERO
1.0000 "application " | INHALATION_SPRAY | CUTANEOUS | Status: DC | PRN
  Administered 2019-02-01: 1 via TOPICAL
  Filled 2019-01-31: qty 56

## 2019-01-31 MED ORDER — LIDOCAINE HCL (PF) 1 % IJ SOLN
INTRAMUSCULAR | Status: DC | PRN
  Administered 2019-01-31: 3 mL

## 2019-01-31 MED ORDER — AMMONIA AROMATIC IN INHA
RESPIRATORY_TRACT | Status: AC
  Filled 2019-01-31: qty 10

## 2019-01-31 MED ORDER — LIDOCAINE HCL (PF) 2 % IJ SOLN
INTRAMUSCULAR | Status: DC | PRN
  Administered 2019-01-31: 3 mL via INTRADERMAL

## 2019-01-31 MED ORDER — LIDOCAINE-EPINEPHRINE (PF) 1.5 %-1:200000 IJ SOLN
INTRAMUSCULAR | Status: DC | PRN
  Administered 2019-01-31: 3 mL via PERINEURAL

## 2019-01-31 MED ORDER — BUPIVACAINE HCL (PF) 0.25 % IJ SOLN
INTRAMUSCULAR | Status: DC | PRN
  Administered 2019-01-31: 10 mL via EPIDURAL

## 2019-01-31 MED ORDER — OXYTOCIN 10 UNIT/ML IJ SOLN: 10.0000 [IU] | Freq: Once | INTRAMUSCULAR | Status: DC

## 2019-01-31 MED ORDER — PHENYLEPHRINE 40 MCG/ML (10ML) SYRINGE FOR IV PUSH (FOR BLOOD PRESSURE SUPPORT)
80.0000 ug | PREFILLED_SYRINGE | INTRAVENOUS | Status: DC | PRN
  Filled 2019-01-31: qty 10

## 2019-01-31 MED ORDER — EPHEDRINE 5 MG/ML INJ: 10.0000 mg | INTRAVENOUS | Status: DC | PRN

## 2019-01-31 MED ORDER — LIDOCAINE HCL (PF) 1 % IJ SOLN
INTRAMUSCULAR | Status: AC
  Filled 2019-01-31: qty 30

## 2019-01-31 MED ORDER — ACETAMINOPHEN 325 MG PO TABS
650.0000 mg | ORAL_TABLET | ORAL | Status: DC | PRN
  Administered 2019-01-31 – 2019-02-01 (×3): 650 mg via ORAL
  Filled 2019-01-31 (×4): qty 2

## 2019-01-31 MED ORDER — SIMETHICONE 80 MG PO CHEW: 80.0000 mg | CHEWABLE_TABLET | ORAL | Status: DC | PRN

## 2019-01-31 MED ORDER — HYDROCODONE-ACETAMINOPHEN 5-325 MG PO TABS: 1.0000 | ORAL_TABLET | Freq: Four times a day (QID) | ORAL | Status: DC | PRN

## 2019-01-31 MED ORDER — OXYTOCIN 40 UNITS IN NORMAL SALINE INFUSION - SIMPLE MED
INTRAVENOUS | Status: AC
  Filled 2019-01-31: qty 1000

## 2019-01-31 MED ORDER — LACTATED RINGERS IV SOLN
INTRAVENOUS | Status: DC
  Administered 2019-01-31: 06:00:00 via INTRAVENOUS

## 2019-01-31 MED ORDER — PRENATAL MULTIVITAMIN CH
1.0000 | ORAL_TABLET | Freq: Every day | ORAL | Status: DC
  Administered 2019-02-02: 1 via ORAL
  Filled 2019-01-31: qty 1

## 2019-01-31 MED ORDER — DIPHENHYDRAMINE HCL 50 MG/ML IJ SOLN: 12.5000 mg | INTRAMUSCULAR | Status: DC | PRN

## 2019-01-31 MED ORDER — OXYTOCIN 40 UNITS IN NORMAL SALINE INFUSION - SIMPLE MED
1.0000 m[IU]/min | INTRAVENOUS | Status: DC
  Administered 2019-01-31: 2 m[IU]/min via INTRAVENOUS

## 2019-01-31 MED ORDER — SENNOSIDES-DOCUSATE SODIUM 8.6-50 MG PO TABS
2.0000 | ORAL_TABLET | ORAL | Status: DC
  Administered 2019-02-01: 2 via ORAL
  Filled 2019-01-31 (×2): qty 2

## 2019-01-31 MED ORDER — DIBUCAINE 1 % RE OINT: 1.0000 "application " | TOPICAL_OINTMENT | RECTAL | Status: DC | PRN

## 2019-01-31 MED ORDER — DIPHENHYDRAMINE HCL 25 MG PO CAPS: 25.0000 mg | ORAL_CAPSULE | Freq: Four times a day (QID) | ORAL | Status: DC | PRN

## 2019-01-31 MED ORDER — LACTATED RINGERS IV SOLN
500.0000 mL | Freq: Once | INTRAVENOUS | Status: AC
  Administered 2019-01-31: 500 mL via INTRAVENOUS

## 2019-01-31 MED ORDER — FENTANYL 2.5 MCG/ML W/ROPIVACAINE 0.15% IN NS 100 ML EPIDURAL (ARMC)
EPIDURAL | Status: AC
  Filled 2019-01-31: qty 100

## 2019-01-31 MED ORDER — OXYTOCIN 10 UNIT/ML IJ SOLN
INTRAMUSCULAR | Status: AC
  Filled 2019-01-31: qty 2

## 2019-01-31 MED ORDER — FERROUS SULFATE 325 (65 FE) MG PO TABS
325.0000 mg | ORAL_TABLET | Freq: Two times a day (BID) | ORAL | Status: DC
  Administered 2019-02-01 – 2019-02-02 (×3): 325 mg via ORAL
  Filled 2019-01-31 (×3): qty 1

## 2019-01-31 MED ORDER — WITCH HAZEL-GLYCERIN EX PADS: 1.0000 "application " | MEDICATED_PAD | CUTANEOUS | Status: DC | PRN

## 2019-01-31 MED ORDER — SOD CITRATE-CITRIC ACID 500-334 MG/5ML PO SOLN: 30.0000 mL | ORAL | Status: DC | PRN

## 2019-01-31 NOTE — Anesthesia Preprocedure Evaluation (Signed)
Anesthesia Evaluation  Patient identified by MRN, date of birth, ID band Patient awake    Reviewed: Allergy & Precautions, NPO status , Patient's Chart, lab work & pertinent test results  Airway Mallampati: II  TM Distance: >3 FB     Dental  (+) Teeth Intact   Pulmonary former smoker,    Pulmonary exam normal        Cardiovascular negative cardio ROS Normal cardiovascular exam     Neuro/Psych  Headaches, negative psych ROS   GI/Hepatic negative GI ROS, Neg liver ROS,   Endo/Other  negative endocrine ROS  Renal/GU negative Renal ROS  negative genitourinary   Musculoskeletal negative musculoskeletal ROS (+)   Abdominal Normal abdominal exam  (+)   Peds negative pediatric ROS (+)  Hematology negative hematology ROS (+)   Anesthesia Other Findings   Reproductive/Obstetrics (+) Pregnancy                             Anesthesia Physical Anesthesia Plan  ASA: II  Anesthesia Plan: Epidural   Post-op Pain Management:    Induction:   PONV Risk Score and Plan:   Airway Management Planned: Natural Airway  Additional Equipment:   Intra-op Plan:   Post-operative Plan:   Informed Consent: I have reviewed the patients History and Physical, chart, labs and discussed the procedure including the risks, benefits and alternatives for the proposed anesthesia with the patient or authorized representative who has indicated his/her understanding and acceptance.     Dental advisory given  Plan Discussed with: CRNA and Surgeon  Anesthesia Plan Comments:         Anesthesia Quick Evaluation

## 2019-01-31 NOTE — Lactation Note (Signed)
This note was copied from a baby's chart. Lactation Consultation Note  Patient Name: Joyce Montoya AYTKZ'S Date: 01/31/2019 Reason for consult: Initial assessment;Mother's request;Primapara;Term;Other (Comment)(Mom timid - not very aggressive when latching)  Assisted mom with comfortable position with pillow support in Birthplace in cross cradle hold skin to skin.  Baby was demonstrating feeding cues which were shown to mom.  Demonstrated hand expression of colostrum to entice him to latch.  Mom very timid and not aggressive with pulling him close.  Explained importance of deep latch and not letting him just get on tip of nipple.  He began rhythmic sucking with occasional swallow.  Mom reports this tugging at the breast is much stronger than first attempt and is maintaining the latch longer.  He sucked for 10 minutes with a few pauses.  Demonstrated how to massage breast and gently stimulate him to keep him actively sucking at the breast.  Reviewed supply and demand, normal course of lactation and routine newborn feeding patterns.   Maternal Data Formula Feeding for Exclusion: No Has patient been taught Hand Expression?: Yes(Can hand express colostrum) Does the patient have breastfeeding experience prior to this delivery?: No  Feeding Feeding Type: Breast Fed  LATCH Score Latch: Repeated attempts needed to sustain latch, nipple held in mouth throughout feeding, stimulation needed to elicit sucking reflex.  Audible Swallowing: A few with stimulation  Type of Nipple: Everted at rest and after stimulation(Small nipples)  Comfort (Breast/Nipple): Soft / non-tender  Hold (Positioning): Assistance needed to correctly position infant at breast and maintain latch.  LATCH Score: 7  Interventions Interventions: Breast feeding basics reviewed;Assisted with latch;Skin to skin;Breast massage;Hand express;Reverse pressure;Breast compression;Adjust position;Support pillows;Position  options  Lactation Tools Discussed/Used WIC Program: Yes   Consult Status Consult Status: Follow-up Follow-up type: Call as needed    [name] 01/31/2019, 6:52 PM

## 2019-01-31 NOTE — H&P (Signed)
History and Physical Interval Note:  01/31/2019 9:25 AM  Joyce Montoya  has presented today for INDUCTION OF LABOR,  with the diagnosis of Favorable cervix at term. The various methods of treatment have been discussed with the patient and family. After consideration of risks, benefits and other options for treatment, the patient has consented to a labor induction.  The patient's history has been reviewed, patient examined, no change in status, and is stable for induction as planned.  See H&P from 01/25/2019. I have reviewed the patient's chart and labs.    Cervix 3.5/60/-1 (RN exam). Induction started with Pitocin.  EFM: Baseline 120 bpm, moderate variability, accelerations present, decelerations absent. Category I tracing.  She denies pain at this time; does not currently have any questions.  Avel Sensor, CNM

## 2019-01-31 NOTE — Anesthesia Procedure Notes (Signed)
Epidural Patient location during procedure: OB Start time: 01/31/2019 11:22 AM End time: 01/31/2019 11:31 AM  Staffing Anesthesiologist: Alvin Critchley, MD Performed: anesthesiologist   Preanesthetic Checklist Completed: patient identified, site marked, surgical consent, pre-op evaluation, timeout performed, IV checked, risks and benefits discussed and monitors and equipment checked  Epidural Patient position: sitting Prep: Betadine Patient monitoring: heart rate, continuous pulse ox and blood pressure Approach: midline Location: L3-L4 Injection technique: LOR air  Needle:  Needle type: Tuohy  Needle gauge: 17 G Needle length: 9 cm and 9 Catheter type: closed end flexible Catheter size: 20 Guage Test dose: negative and 1.5% lidocaine with Epi 1:200 K  Assessment Events: blood not aspirated, injection not painful, no injection resistance, negative IV test and no paresthesia  Additional Notes Time out called.  Patient placed in sitting position. Back prepped and draped in sterile fashion.  A skin wheal was made in the L3-L4 interspace with 1% Lidocaine plain.  A 17G Tuohy needle was advanced into the epidural space by a loss of resistance technique.  The epidural catheter was threaded 3 cm and the TD was negative.  There patient tolerated the procedure well.Reason for block:procedure for pain

## 2019-01-31 NOTE — Discharge Summary (Signed)
OB Discharge Summary     Patient Name: Joyce Montoya DOB: October 12, 1996 MRN: [id number]  Date of admission: 01/31/2019 Delivering MD: Prentice Docker, MD  Date of Delivery: 01/31/2019  Date of discharge: 02/01/2019  Admitting diagnosis: Induction of labor Intrauterine pregnancy: [redacted]w[redacted]d     Secondary diagnosis: None     Discharge diagnosis: Term Pregnancy Delivered                                                                                                Post partum procedures:none  Augmentation: AROM and Pitocin  Complications: None  Hospital course:  Induction of Labor With Vaginal Delivery   23 y.o. yo [id number] at [redacted]w[redacted]d was admitted to the hospital 01/31/2019 for induction of labor.  Indication for induction: elective.  Patient had an uncomplicated labor course as follows: Membrane Rupture Time/Date: 12:49 PM ,01/31/2019   Intrapartum Procedures: Episiotomy: None [1]                                         Lacerations:  1st degree [2];Vaginal [6]  Patient had delivery of a Viable infant.  Information for the patient's newborn:  Sura, Canul [[id number]]  Delivery Method: Vaginal, Spontaneous(Filed from Delivery Summary)   01/31/2019  Details of delivery can be found in separate delivery note.  Patient had a routine postpartum course. Patient is discharged home 02/01/19.  Physical exam  Vitals:   01/31/19 2017 01/31/19 2300 02/01/19 0417 02/01/19 0743  BP: 105/70 110/67 112/77 102/82  Pulse: 78 64 67 71  Resp:  18  18  Temp: 98.6 F (37 C) 98.2 F (36.8 C) 98 F (36.7 C) 98.6 F (37 C)  TempSrc: Oral Oral Oral Oral  SpO2: 99% 99% 100% 100%  Weight:      Height:       General: alert, cooperative and no distress Lochia: appropriate Uterine Fundus: firm Incision: N/A DVT Evaluation: No evidence of DVT seen on physical exam.  Labs: Lab Results  Component Value Date   WBC 9.4 02/01/2019   HGB 10.6 (L) 02/01/2019   HCT 31.7 (L) 02/01/2019   MCV 91.4 02/01/2019   PLT 152 02/01/2019    Discharge instruction: per After Visit Summary.  Medications:  Allergies as of 02/01/2019   No Known Allergies     Medication List    STOP taking these medications   acetaminophen-codeine 300-30 MG tablet Commonly known as:  TYLENOL #3     TAKE these medications   acetaminophen 325 MG tablet Commonly known as:  Tylenol Take 2 tablets (650 mg total) by mouth every 6 (six) hours as needed for mild pain (for pain scale < 4).   docusate sodium 100 MG capsule Commonly known as:  Colace Take 1 capsule (100 mg total) by mouth daily as needed.   ferrous sulfate 325 (65 FE) MG tablet Commonly known as:  FerrouSul Take 1 tablet (325 mg total) by mouth 2 (two) times daily.   ibuprofen 600 MG tablet Commonly known as:  ADVIL,MOTRIN Take 1 tablet (600 mg total) by mouth every 6 (six) hours.            Discharge Care Instructions  (From admission, onward)         Start     Ordered   02/01/19 0000  Discharge wound care:    Comments:  SHOWER DAILY Wash incision gently with soap and water.  Call office with any drainage, redness, or firmness of the incision.   02/01/19 1029          Diet: routine diet  Activity: Advance as tolerated. Pelvic rest for 6 weeks.   Outpatient follow up: Follow-up Information    Will Bonnet, MD. Schedule an appointment as soon as possible for a visit in 6 week(s).   Specialty:  Obstetrics and Gynecology Why:  Routine postpartum visit Contact information: [place] [address] [phone]        [place]. Call in 1 week(s).   Specialty:  Lactation Why:  Call as needed for appointment with lactation. Virtual and telephone visits available.  Contact information: [place] [phone] ar [place] [phone]            Postpartum contraception: undecided IUD vs nexplanon Rhogam Given postpartum: no Rubella  vaccine given postpartum: no Varicella vaccine given postpartum: no TDaP given antepartum or postpartum: 12/10/2018  Newborn Data: Live born female  Birth Weight:   APGAR: 29, 9  Newborn Delivery   Birth date/time:  01/31/2019 15:53:00 Delivery type:  Vaginal, Spontaneous      NEEDS RENAL U/S 1 WEEK POSTPARTUM   Baby Feeding: Breast  Disposition:home with mother  SIGNED: Adrian Prows MD [place], [facility] 02/01/2019 10:32 AM

## 2019-01-31 NOTE — Progress Notes (Signed)
Labor Check  Subj:  Complaints: comfortable with epidural   Obj:  BP 96/70   Pulse (!) 101   Temp 98.1 F (36.7 C) (Oral)   Resp 18   Ht 5\' 1"  (1.549 m)   Wt 63.5 kg   LMP 07/29/2018 (Exact Date)   SpO2 100%   BMI 26.45 kg/m  Dose (milli-units/min) Oxytocin: 10 milli-units/min  Cervix: Dilation: 4 / Effacement (%): 70 / Station: -2   AROM: clear fluid Baseline FHR: 120 beats/min   Variability: moderate   Accelerations: present   Decelerations: absent Contractions: present frequency: 4 q 10 min Overall assessment: cat 1  A/P: 23 y.o. [id number] female at [redacted]w[redacted]d with elective IOL.  1.  Labor: AROM, clear fluid. Continue to titrate pitocin. Consider IUPC, if necessary.  2.  FWB: reassuring, Overall assessment: category 1  3.  GBS negative on 3/10  4.  Pain: epidural 5.  Recheck: 2 hours or prn   Prentice Docker, MD, [place], [facility] 01/31/2019 12:54 PM

## 2019-02-01 LAB — CBC
HCT: 31.7 % — ABNORMAL LOW (ref 36.0–46.0)
Hemoglobin: 10.6 g/dL — ABNORMAL LOW (ref 12.0–15.0)
MCH: 30.5 pg (ref 26.0–34.0)
MCHC: 33.4 g/dL (ref 30.0–36.0)
MCV: 91.4 fL (ref 80.0–100.0)
Platelets: 152 10*3/uL (ref 150–400)
RBC: 3.47 MIL/uL — ABNORMAL LOW (ref 3.87–5.11)
RDW: 12.4 % (ref 11.5–15.5)
WBC: 9.4 10*3/uL (ref 4.0–10.5)
nRBC: 0 % (ref 0.0–0.2)

## 2019-02-01 LAB — RPR: RPR: NONREACTIVE

## 2019-02-01 MED ORDER — DOCUSATE SODIUM 100 MG PO CAPS: 100.0000 mg | ORAL_CAPSULE | Freq: Every day | ORAL | 2 refills | Status: DC | PRN

## 2019-02-01 MED ORDER — IBUPROFEN 600 MG PO TABS: 600.0000 mg | ORAL_TABLET | Freq: Four times a day (QID) | ORAL | 0 refills | Status: DC

## 2019-02-01 MED ORDER — ACETAMINOPHEN 325 MG PO TABS: 650.0000 mg | ORAL_TABLET | Freq: Four times a day (QID) | ORAL | 1 refills | Status: DC | PRN

## 2019-02-01 MED ORDER — FERROUS SULFATE 325 (65 FE) MG PO TABS: 325.0000 mg | ORAL_TABLET | Freq: Two times a day (BID) | ORAL | 11 refills | Status: DC

## 2019-02-01 NOTE — Discharge Instructions (Signed)

## 2019-02-01 NOTE — Lactation Note (Signed)
This note was copied from a baby's chart. Lactation Consultation Note  Patient Name: Joyce Montoya QASTM'H Date: 02/01/2019     Maternal Data    Feeding    LATCH Score                   Interventions    Lactation Tools Discussed/Used     Consult Status  LC went to discuss feeding plan after d/c with family for baby. Parents say that breastfeeding is going well and they had questions concerning how to know if baby is feeding well, how often to feed, what to look for if feedings are not going well. LC addressed questions and did heavy education on feeding cues, signs of fullness, how to continue establishing a milk supply, as well as the stages of transitional stool/color to assist parents with knowing when mature milk is present. Discussed how to receive help after d/c from hospital at peds office with [facility] or virtual consult at [facility] and virtual support group.    [name] 02/01/2019, 11:08 AM

## 2019-02-01 NOTE — Anesthesia Postprocedure Evaluation (Signed)
Anesthesia Post Note  Patient: Joyce Montoya  Procedure(s) Performed: AN AD [place]  Patient location during evaluation: [facility] Unit Anesthesia Type: Epidural Level of consciousness: awake, awake and alert, oriented and patient cooperative Pain management: pain level controlled Vital Signs Assessment: post-procedure vital signs reviewed and stable Respiratory status: spontaneous breathing Cardiovascular status: stable Postop Assessment: no headache, no backache, able to ambulate, adequate PO intake, no apparent nausea or vomiting and patient able to bend at knees Anesthetic complications: no     Last Vitals:  Vitals:   02/01/19 0417 02/01/19 0743  BP: 112/77 102/82  Pulse: 67 71  Resp:  18  Temp: 36.7 C 37 C  SpO2: 100% 100%    Last Pain:  Vitals:   02/01/19 0743  TempSrc: Oral  PainSc:                  Ricki Miller

## 2019-02-01 NOTE — Progress Notes (Signed)
Admit Date: 01/31/2019 Today's Date: 02/01/2019  Post Partum Day 1  Subjective:  no complaints, up ad lib, voiding, tolerating PO, + flatus and breast feeding successfully. Undecided about birth control postpartum. Considering Nexplanon vs IUD.   Objective: Temp:  [97.4 F (36.3 C)-98.6 F (37 C)] 98.6 F (37 C) (03/31 0743) Pulse Rate:  [63-101] 71 (03/31 0743) Resp:  [16-18] 18 (03/31 0743) BP: (96-132)/(56-91) 102/82 (03/31 0743) SpO2:  [96 %-100 %] 100 % (03/31 0743)  Physical Exam:  General: alert, cooperative and appears stated age Lochia: appropriate Uterine Fundus: firm Incision: none DVT Evaluation: No evidence of DVT seen on physical exam.  Recent Labs    01/31/19 0524 02/01/19 0405  HGB 10.4* 10.6*  HCT 30.5* 31.7*    Assessment/Plan: 24 yo s/p SVD- no complications Breast feeding without issue Pain controlled Iron at home for anemia, discussed with patient.  Discussed IUD vs Nexplanon, referred to [email or web address]   LOS: 1 day   [place] 02/01/2019, 10:18 AM

## 2019-02-02 ENCOUNTER — Encounter: Payer: Self-pay | Admitting: Certified Nurse Midwife

## 2019-02-02 NOTE — Progress Notes (Signed)
Post Partum Day 2 Subjective: Patient did not go home yesterday. Pediatrics wanted baby to stay another day to work on breast feeding.  Doing well. Baby breast feeding well. Tolerating a regular diet and voiding without problems. Objective: Blood pressure 109/73, pulse 64, temperature 98.5 F (36.9 C), temperature source Oral, resp. rate 18, height 5\' 1"  (1.549 m), weight 63.5 kg, last menstrual period 07/29/2018, SpO2 97 %, unknown if currently breastfeeding.  Physical Exam:  General: alert, cooperative and no distress Lochia: appropriate Uterine Fundus: firm just below umbilicus/ ML/NT Perineum: not visualized DVT Evaluation: No evidence of DVT seen on physical exam. No significant calf/ankle edema.  Recent Labs    01/31/19 0524 02/01/19 0405  HGB 10.4* 10.6*  HCT 30.5* 31.7*  WBC 5.6 9.4  PLT 156 152    Assessment/Plan: PPD #2 stable and ready for discharge Discharge instruction reviewed Breast TDAP UTD B POS/ RI/ VI IUD   LOS: 2 days   Dalia Heading 02/02/2019, 11:42 AM

## 2019-02-02 NOTE — Discharge Summary (Signed)
OB Discharge Summary     Patient Name: Joyce Montoya DOB: 06/21/1996 MRN: [id number]  Date of admission: 01/31/2019 Delivering MD: Prentice Docker, MD  Date of Delivery: 01/31/2019  Date of discharge: 02/02/2019  Admitting diagnosis: Induction of labor Intrauterine pregnancy: [redacted]w[redacted]d     Secondary diagnosis: None     Discharge diagnosis: Term Pregnancy Delivered                                                                                                Post partum procedures:none  Augmentation: AROM and Pitocin  Complications: None  Hospital course:  Induction of Labor With Vaginal Delivery   23 y.o. yo [id number] at [redacted]w[redacted]d was admitted to the hospital 01/31/2019 for induction of labor.  Indication for induction: elective.  Patient had an uncomplicated labor course as follows: Membrane Rupture Time/Date: 12:49 PM ,01/31/2019   Intrapartum Procedures: Episiotomy: None [1]                                         Lacerations:  1st degree [2];Vaginal [6]  Patient had delivery of a Viable infant.  Information for the patient's newborn:  Elanda, Garmany [[id number]]  Delivery Method: Vag-Spont   01/31/2019  Details of delivery can be found in separate delivery note.  Patient had a routine postpartum course. Patient is discharged home 02/02/19.  Physical exam  Vitals:   02/01/19 1500 02/01/19 2030 02/01/19 2328 02/02/19 0714  BP: 111/66 118/85 112/80 109/73  Pulse: 84 78 (!) 58 64  Resp: 18 18 18 18   Temp: 98.5 F (36.9 C) 98.4 F (36.9 C) 98.5 F (36.9 C) 98.5 F (36.9 C)  TempSrc: Oral Oral Oral Oral  SpO2: 98% 100% 100% 97%  Weight:      Height:       General: alert, cooperative and no distress Lochia: appropriate Uterine Fundus: firm Incision: N/A DVT Evaluation: No evidence of DVT seen on physical exam.  Labs: Lab Results  Component Value Date   WBC 9.4 02/01/2019   HGB 10.6 (L) 02/01/2019   HCT 31.7 (L) 02/01/2019   MCV 91.4 02/01/2019   PLT 152 02/01/2019     Discharge instruction: per After Visit Summary.  Medications:  Allergies as of 02/02/2019   No Known Allergies     Medication List    STOP taking these medications   acetaminophen-codeine 300-30 MG tablet Commonly known as:  TYLENOL #3     TAKE these medications   acetaminophen 325 MG tablet Commonly known as:  Tylenol Take 2 tablets (650 mg total) by mouth every 6 (six) hours as needed for mild pain (for pain scale < 4).   docusate sodium 100 MG capsule Commonly known as:  Colace Take 1 capsule (100 mg total) by mouth daily as needed.   ibuprofen 600 MG tablet Commonly known as:  ADVIL,MOTRIN Take 1 tablet (600 mg total) by mouth every 6 (six) hours.   multivitamin-prenatal 27-0.8 MG Tabs tablet Take 1 tablet by mouth daily at  12 noon.            Discharge Care Instructions  (From admission, onward)         Start     Ordered   02/01/19 0000  Discharge wound care:    Comments:  SHOWER DAILY Wash incision gently with soap and water.  Call office with any drainage, redness, or firmness of the incision.   02/01/19 1029          Diet: routine diet  Activity: Advance as tolerated. Pelvic rest for 6 weeks.   Outpatient follow up: Follow-up Information    Will Bonnet, MD. Schedule an appointment as soon as possible for a visit in 6 week(s).   Specialty:  Obstetrics and Gynecology Why:  Routine postpartum visit Contact information: [place] [address] [phone]        [place]. Call in 1 week(s).   Specialty:  Lactation Why:  Call as needed for appointment with lactation. Virtual and telephone visits available.  Contact information: [place] [phone] ar [place] [phone]            Postpartum contraception: undecided IUD vs nexplanon Rhogam Given postpartum: no Rubella vaccine given postpartum: no Varicella vaccine given  postpartum: no TDaP given antepartum or postpartum: 12/10/2018  Newborn Data:undecided Live born female  Birth Weight: 7#3oz APGAR: 8, 9  Newborn Delivery   Birth date/time:  01/31/2019 15:53:00 Delivery type:  Vaginal, Spontaneous      NEEDS RENAL U/S 1 WEEK POSTPARTUM   Baby Feeding: Breast  Disposition:home with mother  SIGNED: Dalia Heading, [place], [facility] 02/02/2019 12:10 PM

## 2019-02-02 NOTE — Progress Notes (Signed)
Patient discharged home with infant. Discharge instructions, prescriptions and follow up appointment given to and reviewed with patient. Patient verbalized understanding. Patient wheeled out with infant by NT

## 2019-02-10 ENCOUNTER — Telehealth: Payer: Self-pay | Admitting: Obstetrics and Gynecology

## 2019-02-10 NOTE — Telephone Encounter (Signed)
Pt is calling about the birth certificate for the baby. She is stating that the register of deeds has still not received the paperwork from the [place]. Would like to know what she is needing to do. She has called to hospital and they will not let her speak with the L&D nurse to see what needs to be done. Would like to see if you could help her out with this.   Cb# [phone]

## 2019-02-10 NOTE — Telephone Encounter (Signed)
Pt coming in on 03/14/19 for her 6 wk and iud insertion unsure of which IUD she is wanting according to discharge notes.

## 2019-02-10 NOTE — Telephone Encounter (Signed)
I have sent an email to Joyce Montoya who is the director of [facility] at [facility] to see who the best contact might be for her. I'll keep her posted once I have some information for her. Thanks!

## 2019-02-10 NOTE — Telephone Encounter (Signed)
PT aware

## 2019-02-17 ENCOUNTER — Telehealth: Payer: Self-pay

## 2019-02-17 ENCOUNTER — Encounter: Payer: Self-pay | Admitting: Maternal Newborn

## 2019-02-17 ENCOUNTER — Ambulatory Visit (INDEPENDENT_AMBULATORY_CARE_PROVIDER_SITE_OTHER): Payer: 59 | Admitting: Maternal Newborn

## 2019-02-17 ENCOUNTER — Other Ambulatory Visit: Payer: Self-pay

## 2019-02-17 VITALS — BP 100/70 | Ht 61.0 in | Wt 123.0 lb

## 2019-02-17 DIAGNOSIS — O9123 Nonpurulent mastitis associated with lactation: Secondary | ICD-10-CM

## 2019-02-17 MED ORDER — CEPHALEXIN 500 MG PO CAPS: 500.0000 mg | ORAL_CAPSULE | Freq: Four times a day (QID) | ORAL | 0 refills | Status: AC

## 2019-02-17 NOTE — Patient Instructions (Signed)
Mastitis    Mastitis is inflammation of the breast tissue. It occurs most often in women who are breastfeeding, but it can also affect other women, and sometimes even men.  What are the causes?  This condition is usually caused by a bacterial infection. Bacteria enter the breast tissue through cuts or openings in the skin. Typically, this occurs with breastfeeding because of cracked or irritated nipples. Sometimes, it can occur when there is no opening in the skin. This is usually caused by plugged milk ducts.  Other causes include:   Nipple piercing.   Some forms of breast cancer.  What are the signs or symptoms?  Symptoms of this condition include:   Swelling, redness, tenderness, and pain in an area of the breast. The area may also feel warm to the touch. These symptoms usually affect the upper part of the breast, toward the armpit region.   Swelling of the glands under the arm on the same side.   Fever.   Rapid pulse.   Fatigue, headache, and flu-like muscle aches.  If an infection is allowed to progress, a collection of pus (abscess) may develop.  How is this diagnosed?  This condition can usually be diagnosed based on a physical exam and your symptoms. You may also have other tests, such as:   Blood tests to determine if your body is fighting a bacterial infection.   Mammogram or ultrasound tests to rule out other problems or diseases.   Testing of pus and other fluids. Pus from the breast may be collected and examined in the lab. If an abscess has developed, the fluid in the abscess can be removed with a needle. This test can be used to confirm the diagnosis and identify the bacteria present.   If you are breastfeeding, breast milk may be cultured and tested for bacteria.  How is this treated?  Treatment for this condition may include:   Applying heat or cold compresses to the affected area.   Medicine for pain.   Antibiotic medicine to treat a bacterial infection. This is usually taken by  mouth.   Self-care such as rest and increased fluid intake.   If an abscess has developed, it may be treated by removing fluid with a needle.  Mastitis that occurs with breastfeeding will sometimes go away on its own, so your health care provider may choose to wait 24 hours after first seeing you to decide whether a prescription medicine is needed. You may be told of different ways to help manage breastfeeding, such as continuing to breastfeed or pump in order to ensure adequate milk flow.  Follow these instructions at home:  Medicines   Take over-the-counter and prescription medicines only as told by your health care provider.   If you were prescribed an antibiotic medicine, take it as told by your health care provider. Do not stop taking the antibiotic even if you start to feel better.  General instructions   Do not wear a tight or underwire bra. Wear a soft, supportive bra.   Increase your fluid intake, especially if you have a fever.   Get plenty of rest.  If you are breastfeeding:   Continue to empty your breasts as often as possible either by breastfeeding or by using a breast pump. This will decrease the pressure and the pain that comes with it. Ask your health care provider if changes need to be made to your breastfeeding or pumping routine.   Keep your nipples clean and dry.     During breastfeeding, empty the first breast completely before going to the other breast. If your baby is not emptying your breasts completely, use a breast pump to empty your breasts.   Use breast massage during feeding or pumping sessions.   If directed, apply moist heat to the affected area of your breast right before breastfeeding or pumping. Use the heat source that your health care provider recommends.   If directed, put ice on the affected area of your breast right after breastfeeding or pumping:  ? Put ice in a plastic bag.  ? Place a towel between your skin and the bag.  ? Leave the ice on for 20 minutes.   If  you go back to work, pump your breasts while at work to stay within your nursing schedule.   Avoid allowing your breasts to become overly filled with milk (engorged).  Contact a health care provider if:   You have pus-like discharge from the breast.   You have a fever.   Your symptoms do not improve within 2 days of starting treatment.  Get help right away if:   Your pain and swelling are getting worse.   You have pain that is not controlled with medicine.   You have a red line extending from the breast toward your armpit.  Summary   Mastitis is inflammation of the breast tissue. It occurs most often in women who are breastfeeding, but it can also affect non-breastfeeding women and some men.   This condition is usually caused by a bacterial infection.   This condition may be treated with hot and cold compresses, medicines, self-care, and certain breastfeeding strategies.   If you were prescribed an antibiotic medicine, take it as told by your health care provider. Do not stop taking the antibiotic even if you start to feel better.  This information is not intended to replace advice given to you by your health care provider. Make sure you discuss any questions you have with your health care provider.  Document Released: 10/20/2005 Document Revised: 11/11/2016 Document Reviewed: 11/11/2016  Elsevier Interactive Patient Education  2019 [employer]

## 2019-02-17 NOTE — Telephone Encounter (Signed)
Pt returned phone call and said she had a fever again around 2 pm, no other symptoms showing besides the fever. Said she is producing a lot of breastmilk, and that the vaginal bleeding going on slows down and then picks up at times. She did confirm not having to change more than once in an hour. Asked JS for advise, she will see her this afternoon, possibly mastitis.

## 2019-02-17 NOTE — Progress Notes (Signed)
Postpartum Problem Visit  Subjective: Joyce Montoya is two weeks postpartum following a vaginal delivery. This morning, she took her temperature and was febrile at 101 degrees Fahrenheit. She rechecked her temperature right before coming to clinic and her fever was at 102 degrees. She has new breast pain and swelling in her left breast. It is tender to the touch, no redness or streaks present on skin. She has recently changed from mostly pumping and bottle feeding to feeding baby at the breast with nipple shields. She has a headache that began this morning. She noticed an increase in her vaginal bleeding from earlier this week, but no foul lochia, large clots or heavy bleeding.  Review of Systems  Constitutional: Positive for fever.  HENT: Negative.   Eyes: Negative.   Respiratory: Negative for cough, sputum production and shortness of breath.   Cardiovascular: Negative.   Gastrointestinal: Negative.   Genitourinary:       Increase in lochia from previous days  Musculoskeletal: Negative for myalgias.  Skin: Negative.   Neurological: Positive for headaches.  Endo/Heme/Allergies: Negative.   Psychiatric/Behavioral: Negative.     Objective:  Blood pressure 100/70, height 5\' 1"  (1.549 m), weight 123 lb (55.8 kg), currently breastfeeding.  Physical Exam Constitutional:      General: She is not in acute distress. Genitourinary:     Genitourinary Comments: Lochia light red and small amount on exam, pad changed about 2 hours ago  Pulmonary:     Effort: Pulmonary effort is normal. No respiratory distress.  Chest:     Breasts:        Right: No swelling, skin change or tenderness.        Left: Swelling and tenderness present. No skin change.    Abdominal:     Palpations: Abdomen is soft.  Neurological:     Mental Status: She is alert.  Psychiatric:        Mood and Affect: Mood normal.        Behavior: Behavior normal.     Assessment/Plan:  Concern for mastitis. Discussed empiric  antibiotic treatment. Rx for Keflex sent. Empty breasts fully, massage affected area while feeding/pumping, recommended cold compresses. Tylenol or ibuprofen for fever and headache. Call if bleeding becomes heavy or she develops pelvic pain.   Patient to go to PCP if fever persists and she develops symptoms of a viral respiratory infection.  Avel Sensor, CNM 02/17/2019

## 2019-02-17 NOTE — Telephone Encounter (Signed)
Pt called triage to say this morning around 7 am she had a fever, and one of her breasts is in pain, and is bleeding more. Called pt back to get more info, no answer, so I LVMTRC.

## 2019-02-17 NOTE — Telephone Encounter (Signed)
This encounter was created in error - please disregard.

## 2019-03-08 ENCOUNTER — Other Ambulatory Visit (HOSPITAL_COMMUNITY)
Admission: RE | Admit: 2019-03-08 | Discharge: 2019-03-08 | Disposition: A | Discharge status: Home / Self Care | Payer: Medicaid Other | Source: Ambulatory Visit | Attending: Obstetrics and Gynecology | Admitting: Obstetrics and Gynecology

## 2019-03-08 ENCOUNTER — Ambulatory Visit (INDEPENDENT_AMBULATORY_CARE_PROVIDER_SITE_OTHER): Payer: Medicaid Other | Admitting: Obstetrics and Gynecology

## 2019-03-08 ENCOUNTER — Encounter: Payer: Self-pay | Admitting: Obstetrics and Gynecology

## 2019-03-08 ENCOUNTER — Other Ambulatory Visit: Payer: Self-pay

## 2019-03-08 DIAGNOSIS — Z113 Encounter for screening for infections with a predominantly sexual mode of transmission: Secondary | ICD-10-CM

## 2019-03-08 DIAGNOSIS — Z124 Encounter for screening for malignant neoplasm of cervix: Secondary | ICD-10-CM | POA: Insufficient documentation

## 2019-03-08 DIAGNOSIS — Z30430 Encounter for insertion of intrauterine contraceptive device: Secondary | ICD-10-CM | POA: Diagnosis not present

## 2019-03-08 MED ORDER — LEVONORGESTREL 20 MCG/24HR IU IUD: 1.0000 | INTRAUTERINE_SYSTEM | Freq: Once | INTRAUTERINE | 0 refills | Status: DC

## 2019-03-08 NOTE — Progress Notes (Addendum)
Postpartum Visit   Chief Complaint  Patient presents with  . Postpartum Care   History of Present Illness: Patient is a 23 y.o. [id number] presents for postpartum visit.  Date of delivery: 01/31/2019 Type of delivery: Vaginal delivery - Vacuum or forceps assisted  no Episiotomy No.  Laceration: yes, 1st degree vaginal Pregnancy or labor problems:  no Any problems since the delivery:  no  Newborn Details:  SINGLETON :  1. Baby's name: Rodman Key. Birth weight: 7.3lb Maternal Details:  Breast Feeding:  BREAST and BOTTLE Post partum depression/anxiety noted:  no Edinburgh Post-Partum Depression Score:  4  Date of last PAP: unknown   Past Medical History:  Diagnosis Date  . Frequent headaches   . History of chlamydia   . Irregular menses   . SAB (spontaneous abortion)   . SAB (spontaneous abortion) 11/2016    Past Surgical History:  Procedure Laterality Date  . HAND SURGERY Right 06/2016  . WISDOM TOOTH EXTRACTION      Prior to Admission medications   Medication Sig Start Date End Date Taking? Authorizing Provider  Prenatal Vit-Fe Fumarate-FA (MULTIVITAMIN-PRENATAL) 27-0.8 MG TABS tablet Take 1 tablet by mouth daily at 12 noon.    [provider]   Allergies: No Known Allergies   Social History   Socioeconomic History  . Marital status: Single    Spouse name: Not on file  . Number of children: Not on file  . Years of education: Not on file  . Highest education level: Not on file  Occupational History  . Not on file  Social Needs  . Financial resource strain: Not on file  . Food insecurity:    Worry: Not on file    Inability: Not on file  . Transportation needs:    Medical: Not on file    Non-medical: Not on file  Tobacco Use  . Smoking status: Former Smoker    Packs/day: 0.25    Types: Cigarettes  . Smokeless tobacco: Never Used  Substance and Sexual Activity  . Alcohol use: Not Currently    Comment: social  . Drug use: No  . Sexual activity:  Yes    Partners: Male    Birth control/protection: None  Lifestyle  . Physical activity:    Days per week: Not on file    Minutes per session: Not on file  . Stress: Not on file  Relationships  . Social connections:    Talks on phone: Not on file    Gets together: Not on file    Attends religious service: Not on file    Active member of club or organization: Not on file    Attends meetings of clubs or organizations: Not on file    Relationship status: Not on file  . Intimate partner violence:    Fear of current or ex partner: Not on file    Emotionally abused: Not on file    Physically abused: Not on file    Forced sexual activity: Not on file  Other Topics Concern  . Not on file  Social History Narrative   Single.   Works for [employer].   Enjoys riding her horses.     Family History  Problem Relation Age of Onset  . Headache Mother   . Ovarian cysts Mother   . Headache Sister     Review of Systems  Constitutional: Negative.   HENT: Negative.   Eyes: Negative.   Respiratory: Negative.   Cardiovascular: Negative.   Gastrointestinal: Negative.  Genitourinary: Negative.   Musculoskeletal: Negative.   Skin: Negative.   Neurological: Negative.   Psychiatric/Behavioral: Negative.      Physical Exam BP 118/74   Ht 5\' 1"  (1.549 m)   Wt 122 lb (55.3 kg)   BMI 23.05 kg/m   Physical Exam Constitutional:      General: She is not in acute distress.    Appearance: Normal appearance. She is well-developed.  Genitourinary:     Pelvic exam was performed with patient in the lithotomy position.     Vulva, inguinal canal, urethra, bladder, vagina, uterus, right adnexa and left adnexa normal.     No posterior fourchette tenderness, injury or lesion present.     No cervical friability, lesion, bleeding or polyp.     Uterus is anteverted.  HENT:     Head: Normocephalic and atraumatic.  Eyes:     General: No scleral icterus.    Conjunctiva/sclera:  Conjunctivae normal.  Neck:     Musculoskeletal: Normal range of motion and neck supple.  Cardiovascular:     Rate and Rhythm: Normal rate and regular rhythm.     Heart sounds: No murmur. No friction rub. No gallop.   Pulmonary:     Effort: Pulmonary effort is normal. No respiratory distress.     Breath sounds: Normal breath sounds. No wheezing or rales.  Abdominal:     General: Bowel sounds are normal. There is no distension.     Palpations: Abdomen is soft. There is no mass.     Tenderness: There is no abdominal tenderness. There is no guarding or rebound.  Musculoskeletal: Normal range of motion.  Neurological:     General: No focal deficit present.     Mental Status: She is alert and oriented to person, place, and time.     Cranial Nerves: No cranial nerve deficit.  Skin:    General: Skin is warm and dry.     Findings: No erythema.  Psychiatric:        Mood and Affect: Mood normal.        Behavior: Behavior normal.        Judgment: Judgment normal.     IUD Insertion Procedure Note Patient identified, informed consent performed, consent signed.   Discussed risks of irregular bleeding, cramping, infection, malpositioning, expulsion or uterine perforation of the IUD (1:1000 placements)  which may require further procedure such as laparoscopy.  IUD while effective at preventing pregnancy do not prevent transmission of sexually transmitted diseases and use of barrier methods for this purpose was discussed. Time out was performed.  Urine pregnancy test negative.  Speculum placed in the vagina.  Cervix visualized.  Cleaned with Betadine x 2.  Grasped anteriorly with a single tooth tenaculum.  Uterus sounded to 7.5 cm. IUD placed per manufacturer's recommendations.  Strings trimmed to 3 cm. Tenaculum was removed, good hemostasis noted.  Patient tolerated procedure well.   Patient was given post-procedure instructions.  She was advised to have backup contraception for one week.  Patient  was also asked to check IUD strings periodically and follow up in 4 weeks for IUD check.   Female Chaperone present during breast and/or pelvic exam.  Assessment: 23 y.o. [id number] presenting for 6 week postpartum visit  Plan: Problem List Items Addressed This Visit    None    Visit Diagnoses    Postpartum care and examination    -  Primary   Relevant Medications   levonorgestrel (MIRENA) 20 MCG/24HR IUD   Other  Relevant Orders   Cytology - PAP   Encounter for IUD insertion       Relevant Medications   levonorgestrel (MIRENA) 20 MCG/24HR IUD   Pap smear for cervical cancer screening       Relevant Orders   Cytology - PAP   Screen for STD (sexually transmitted disease)       Relevant Orders   Cytology - PAP     1) Contraception Education given regarding options for contraception, including IUD placement.  2)  Pap - ASCCP guidelines and rational discussed.  Patient opts for routine screening interval. Pap smear performed today.  3) Patient underwent screening for postpartum depression with no concerns noted.  Return in about 4 weeks (around 04/05/2019) for IUD String check.   Prentice Docker, MD 03/08/2019 10:34 AM

## 2019-03-08 NOTE — Addendum Note (Signed)
Addended by: Prentice Docker D on: 03/08/2019 10:34 AM   Modules accepted: Orders

## 2019-03-14 ENCOUNTER — Ambulatory Visit: Payer: 59 | Admitting: Obstetrics and Gynecology

## 2019-03-16 LAB — CYTOLOGY - PAP
Chlamydia: NEGATIVE
Diagnosis: UNDETERMINED — AB
HPV: NOT DETECTED
Neisseria Gonorrhea: NEGATIVE

## 2019-03-17 NOTE — Telephone Encounter (Signed)
Pt rcvd/charged Mirena & insertion 03/08/2019

## 2019-04-05 ENCOUNTER — Ambulatory Visit: Payer: 59 | Admitting: Obstetrics and Gynecology

## 2019-08-30 NOTE — Progress Notes (Signed)
   Chief Complaint  Patient presents with  . IUD removal    pt thinks it is cause of her mood swings, has noticed since insertion of IUD     History of Present Illness:  Joyce Montoya is a 23 y.o. that had a Mirena IUD placed approximately 5 months ago at [facility] visit. S/p SVD 3/20. Since that time, she denies dyspareunia, pelvic pain, non-menstrual bleeding, vaginal d/c, heavy bleeding. She is having monthly menses, lasting a few days. She also has mood swings and wonders if related to stress of baby vs IUD. Would like IUD removed. Not sex active currently. Pt denies depression sx.  May want BC at a later time.  BP 90/60   Ht 5\' 1"  (1.549 m)   Wt 118 lb (53.5 kg)   LMP 08/08/2019 (Approximate)   Breastfeeding No   BMI 22.30 kg/m   Pelvic exam:  Two IUD strings present seen coming from the cervical os. EGBUS, vaginal vault and cervix: within normal limits  IUD Removal Strings of IUD identified and grasped.  IUD removed without problem with ring forceps.  Pt tolerated this well.  IUD noted to be intact.  Assessment:  Encounter for removal of intrauterine contraceptive device (IUD)  Mood swings--see if sx improve once IUD removed. F/u prn. Also need to consider PP blues/depression if sx persist.   Plan: IUD removed and plan for contraception is abstinence. She was amenable to this plan.  Elleni Mozingo B. Morine Kohlman, PA-C 08/31/2019 9:12 AM

## 2019-08-31 ENCOUNTER — Encounter: Payer: Self-pay | Admitting: Obstetrics and Gynecology

## 2019-08-31 ENCOUNTER — Other Ambulatory Visit: Payer: Self-pay

## 2019-08-31 ENCOUNTER — Ambulatory Visit (INDEPENDENT_AMBULATORY_CARE_PROVIDER_SITE_OTHER): Payer: 59 | Admitting: Obstetrics and Gynecology

## 2019-08-31 VITALS — BP 90/60 | Ht 61.0 in | Wt 118.0 lb

## 2019-08-31 DIAGNOSIS — Z30432 Encounter for removal of intrauterine contraceptive device: Secondary | ICD-10-CM

## 2019-08-31 NOTE — Patient Instructions (Signed)
I value your feedback and entrusting us with your care. If you get a [facility] patient survey, I would appreciate you taking the time to let us know about your experience today. Thank you! 

## 2019-09-22 ENCOUNTER — Telehealth: Payer: Self-pay

## 2019-09-22 ENCOUNTER — Other Ambulatory Visit: Payer: Self-pay | Admitting: Obstetrics and Gynecology

## 2019-09-22 MED ORDER — NORETHIN ACE-ETH ESTRAD-FE 1-20 MG-MCG PO TABS: 1.0000 | ORAL_TABLET | Freq: Every day | ORAL | 1 refills | Status: DC

## 2019-09-22 NOTE — Telephone Encounter (Signed)
Pt calling to see if she can be rx'd the bc she was on prior to her last preg without an appt.  [phone]

## 2019-09-22 NOTE — Telephone Encounter (Signed)
Please see

## 2019-09-22 NOTE — Progress Notes (Signed)
Rx OCP to start with next menses. Annual due 3/21

## 2019-09-22 NOTE — Telephone Encounter (Signed)
I sent OCPs to pharm. Start first sun after next mense, condoms for 1 mo. F/u at 3/21 annual. May have BTB first 3 pill packs.

## 2019-09-23 NOTE — Telephone Encounter (Signed)
Called pt, no answer, LVMTRC. 

## 2019-10-03 NOTE — Telephone Encounter (Signed)
Pt aware.

## 2020-01-02 ENCOUNTER — Encounter: Payer: Self-pay | Admitting: Obstetrics and Gynecology

## 2020-01-02 ENCOUNTER — Ambulatory Visit (INDEPENDENT_AMBULATORY_CARE_PROVIDER_SITE_OTHER): Payer: Medicaid Other | Admitting: Obstetrics and Gynecology

## 2020-01-02 ENCOUNTER — Other Ambulatory Visit: Payer: Self-pay

## 2020-01-02 ENCOUNTER — Other Ambulatory Visit (HOSPITAL_COMMUNITY)
Admission: RE | Admit: 2020-01-02 | Discharge: 2020-01-02 | Disposition: A | Discharge status: Home / Self Care | Payer: Medicaid Other | Source: Ambulatory Visit | Attending: Obstetrics and Gynecology | Admitting: Obstetrics and Gynecology

## 2020-01-02 VITALS — BP 100/60 | Ht 61.0 in | Wt 123.0 lb

## 2020-01-02 DIAGNOSIS — N898 Other specified noninflammatory disorders of vagina: Secondary | ICD-10-CM

## 2020-01-02 DIAGNOSIS — R3989 Other symptoms and signs involving the genitourinary system: Secondary | ICD-10-CM | POA: Diagnosis not present

## 2020-01-02 DIAGNOSIS — R399 Unspecified symptoms and signs involving the genitourinary system: Secondary | ICD-10-CM

## 2020-01-02 DIAGNOSIS — Z113 Encounter for screening for infections with a predominantly sexual mode of transmission: Secondary | ICD-10-CM | POA: Diagnosis not present

## 2020-01-02 DIAGNOSIS — N941 Unspecified dyspareunia: Secondary | ICD-10-CM

## 2020-01-02 LAB — POCT URINALYSIS DIPSTICK
Bilirubin, UA: NEGATIVE
Blood, UA: NEGATIVE
Glucose, UA: NEGATIVE
Ketones, UA: NEGATIVE
Nitrite, UA: NEGATIVE
Protein, UA: NEGATIVE
Spec Grav, UA: 1.015 (ref 1.010–1.025)
pH, UA: 6 (ref 5.0–8.0)

## 2020-01-02 LAB — POCT WET PREP WITH KOH
Clue Cells Wet Prep HPF POC: NEGATIVE
KOH Prep POC: NEGATIVE
Trichomonas, UA: NEGATIVE
Yeast Wet Prep HPF POC: NEGATIVE

## 2020-01-02 MED ORDER — FLUCONAZOLE 150 MG PO TABS: 150.0000 mg | ORAL_TABLET | Freq: Once | ORAL | 0 refills | Status: AC

## 2020-01-02 NOTE — Patient Instructions (Signed)
I value your feedback and entrusting us with your care. If you get a [facility] patient survey, I would appreciate you taking the time to let us know about your experience today. Thank you!  As of October 13, 2019, your lab results will be released to your MyChart immediately, before I even have a chance to see them. Please give me time to review them and contact you if there are any abnormalities. Thank you for your patience.  

## 2020-01-02 NOTE — Progress Notes (Signed)
[name], NP   Chief Complaint  Patient presents with  . Urinary Tract Infection    HPI:      [name] is a 24 y.o. [phone] who LMP was No LMP recorded., presents today for increased vag d/c without itching/irritaiton, no odor. Hx of BV in past but not recently. Also with occas dysuria, pelvic pains for a few hrs, urine odor, over past wk. Sx intermittent and fluctuate. No hematuria, LBP, fevers, frequency/urgency. No recent UTIs.  She is sex active, no new partners. Using OCPs. Has had burning with sex over the past wk. Uses water to clean usually but has been using bar soap recently.    Past Medical History:  Diagnosis Date  . Frequent headaches   . History of chlamydia   . Irregular menses   . SAB (spontaneous abortion)   . SAB (spontaneous abortion) 11/2016    Past Surgical History:  Procedure Laterality Date  . HAND SURGERY Right 06/2016  . WISDOM TOOTH EXTRACTION      Family History  Problem Relation Age of Onset  . Headache Mother   . Ovarian cysts Mother   . Headache Sister     Social History   Socioeconomic History  . Marital status: Single    Spouse name: Not on file  . Number of children: Not on file  . Years of education: Not on file  . Highest education level: Not on file  Occupational History  . Not on file  Tobacco Use  . Smoking status: Former Smoker    Packs/day: 0.25    Types: Cigarettes  . Smokeless tobacco: Never Used  Substance and Sexual Activity  . Alcohol use: Not Currently    Comment: social  . Drug use: No  . Sexual activity: Yes    Partners: Male    Birth control/protection: I.U.D.    Comment: Mirena  Other Topics Concern  . Not on file  Social History Narrative   Single.   Works for [employer].   Enjoys riding her horses.    Social Determinants of Health   Financial Resource Strain:   . Difficulty of Paying Living Expenses: Not on file  Food Insecurity:   . Worried About Ship broker in the Last Year: Not on file  . Ran Out of Food in the Last Year: Not on file  Transportation Needs:   . Lack of Transportation (Medical): Not on file  . Lack of Transportation (Non-Medical): Not on file  Physical Activity:   . Days of Exercise per Week: Not on file  . Minutes of Exercise per Session: Not on file  Stress:   . Feeling of Stress : Not on file  Social Connections:   . Frequency of Communication with Friends and Family: Not on file  . Frequency of Social Gatherings with Friends and Family: Not on file  . Attends Religious Services: Not on file  . Active Member of Clubs or Organizations: Not on file  . Attends Archivist Meetings: Not on file  . Marital Status: Not on file  Intimate Partner Violence:   . Fear of Current or Ex-Partner: Not on file  . Emotionally Abused: Not on file  . Physically Abused: Not on file  . Sexually Abused: Not on file    Outpatient Medications Prior to Visit  Medication Sig Dispense Refill  . norethindrone-ethinyl estradiol ([name] 1/20) 1-20 MG-MCG tablet Take 1 tablet by mouth daily. 3  Package 1   No facility-administered medications prior to visit.      ROS:  Review of Systems  Constitutional: Negative for fever.  Gastrointestinal: Negative for blood in stool, constipation, diarrhea, nausea and vomiting.  Genitourinary: Positive for dyspareunia, dysuria and vaginal discharge. Negative for flank pain, frequency, hematuria, urgency, vaginal bleeding and vaginal pain.  Musculoskeletal: Negative for back pain.  Skin: Negative for rash.  BREAST: No symptoms   OBJECTIVE:   Vitals:  BP 100/60   Ht 5\' 1"  (1.549 m)   Wt 123 lb (55.8 kg)   BMI 23.24 kg/m   Physical Exam Vitals reviewed.  Constitutional:      Appearance: She is well-developed.  Pulmonary:     Effort: Pulmonary effort is normal.  Genitourinary:    Pubic Area: No rash.      Labia:        Right: No rash, tenderness or lesion.        Left:  No rash, tenderness or lesion.      Vagina: Normal. No vaginal discharge, erythema or tenderness.     Cervix: Normal.     Uterus: Normal. Not enlarged and not tender.      Adnexa: Right adnexa normal and left adnexa normal.       Right: No mass or tenderness.         Left: No mass or tenderness.      Musculoskeletal:        General: Normal range of motion.     Cervical back: Normal range of motion.  Skin:    General: Skin is warm and dry.  Neurological:     General: No focal deficit present.     Mental Status: She is alert and oriented to person, place, and time.  Psychiatric:        Mood and Affect: Mood normal.        Behavior: Behavior normal.        Thought Content: Thought content normal.        Judgment: Judgment normal.     Results: Results for orders placed or performed in visit on 01/02/20 (from the past 24 hour(s))  POCT Urinalysis Dipstick     Status: Abnormal   Collection Time: 01/02/20  2:38 PM  Result Value Ref Range   Color, UA yellow    Clarity, UA clear    Glucose, UA Negative Negative   Bilirubin, UA neg    Ketones, UA neg    Spec Grav, UA 1.015 1.010 - 1.025   Blood, UA neg    pH, UA 6.0 5.0 - 8.0   Protein, UA Negative Negative   Urobilinogen, UA     Nitrite, UA neg    Leukocytes, UA Trace (A) Negative   Appearance     Odor    POCT Wet Prep with KOH     Status: Normal   Collection Time: 01/02/20  2:39 PM  Result Value Ref Range   Trichomonas, UA Negative    Clue Cells Wet Prep HPF POC negn    Epithelial Wet Prep HPF POC     Yeast Wet Prep HPF POC neg    Bacteria Wet Prep HPF POC     RBC Wet Prep HPF POC     WBC Wet Prep HPF POC     KOH Prep POC Negative Negative     Assessment/Plan: Vaginal discharge - Plan: POCT Wet Prep with KOH, fluconazole (DIFLUCAN) 150 MG tablet; Neg wet prep/exam. Fissures on ext exam.  Question fungal. Treat with diflucan. Rule out STD. Fu prn.   UTI symptoms - Plan: Urine Culture, POCT Urinalysis Dipstick;  Essent neg UA. Check C&S. If neg, most likely sx from vaginitis.  Screening for STD (sexually transmitted disease) - Plan: Cervicovaginal ancillary only    Meds ordered this encounter  Medications  . fluconazole (DIFLUCAN) 150 MG tablet    Sig: Take 1 tablet (150 mg total) by mouth once for 1 dose.    Dispense:  1 tablet    Refill:  0    Order Specific Question:   Supervising Provider    Answer:   Gae Dry [id number]      Return if symptoms worsen or fail to improve.  Alicia B. Copland, PA-C 01/02/2020 2:40 PM

## 2020-01-04 LAB — CERVICOVAGINAL ANCILLARY ONLY
Chlamydia: NEGATIVE
Comment: NEGATIVE
Comment: NORMAL
Neisseria Gonorrhea: NEGATIVE

## 2020-01-04 LAB — URINE CULTURE: Organism ID, Bacteria: NO GROWTH

## 2020-02-18 ENCOUNTER — Other Ambulatory Visit: Payer: Self-pay | Admitting: Obstetrics and Gynecology

## 2020-03-01 DIAGNOSIS — J029 Acute pharyngitis, unspecified: Secondary | ICD-10-CM | POA: Diagnosis not present

## 2020-03-01 DIAGNOSIS — Z20822 Contact with and (suspected) exposure to covid-19: Secondary | ICD-10-CM | POA: Diagnosis not present

## 2020-03-01 DIAGNOSIS — R300 Dysuria: Secondary | ICD-10-CM | POA: Diagnosis not present

## 2020-03-01 DIAGNOSIS — N390 Urinary tract infection, site not specified: Secondary | ICD-10-CM | POA: Diagnosis not present

## 2020-03-13 ENCOUNTER — Other Ambulatory Visit: Payer: Self-pay | Admitting: Obstetrics and Gynecology

## 2020-03-28 IMAGING — US US RENAL
1 series · 14 of 25 positions shown · non-contrast
Comparison: None.

CLINICAL DATA: Microscopic hematuria

Thirty-one week 5 day pregnancy
EXAM:
RENAL / URINARY TRACT ULTRASOUND COMPLETE

[Series 1: us renal · 14 of 58 slices shown]
[im 1/58]
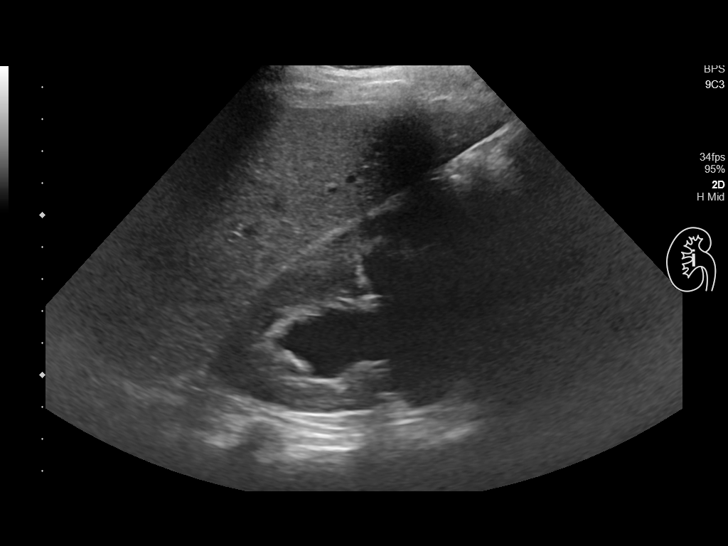
[im 5/58]
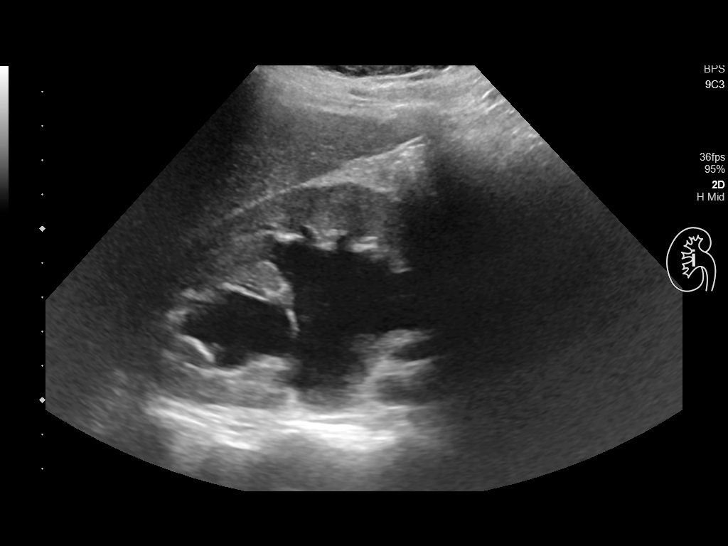
[im 10/58]
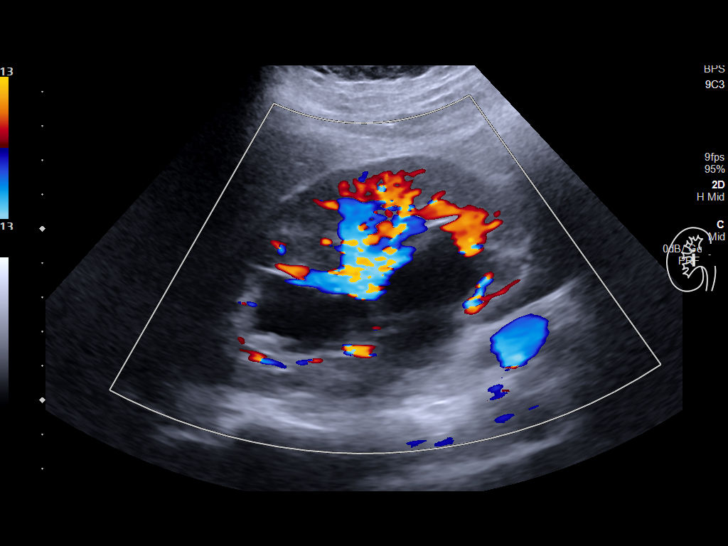
[im 15/58]
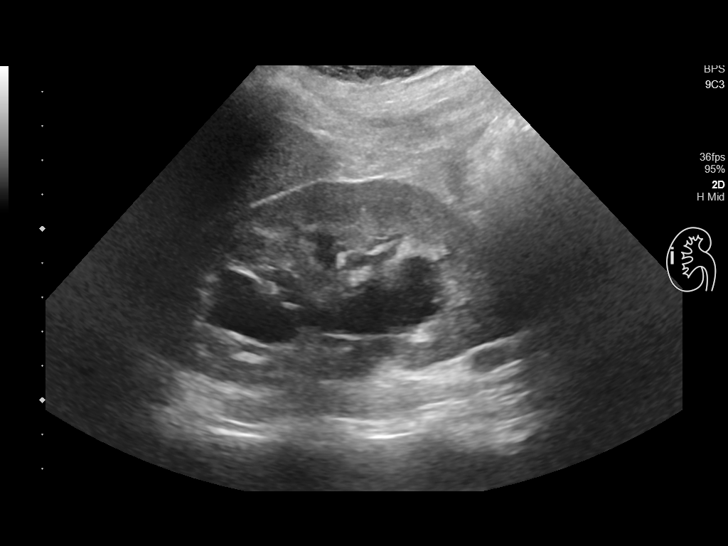
[im 20/58]
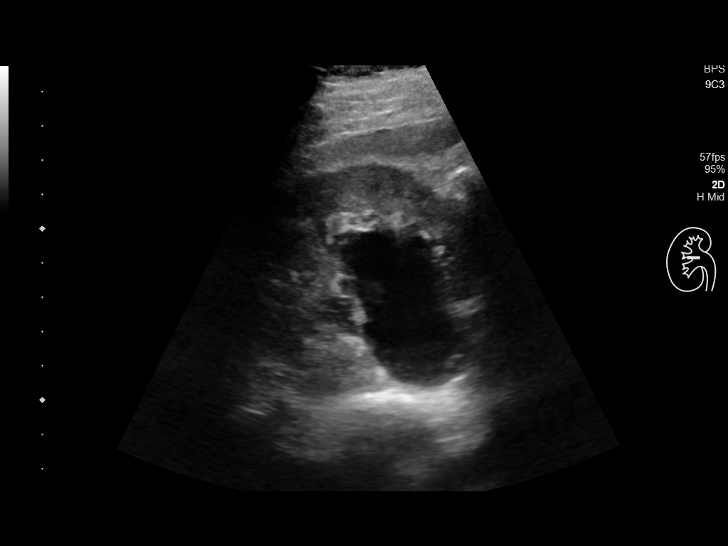
[im 22/58]
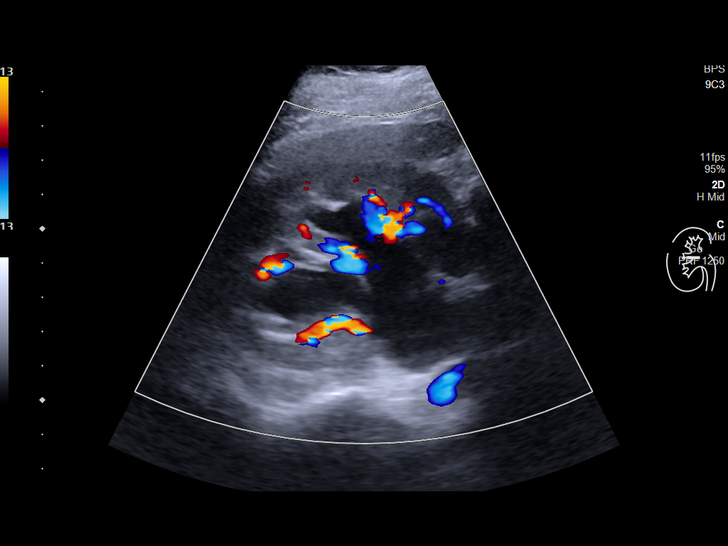
[im 27/58]
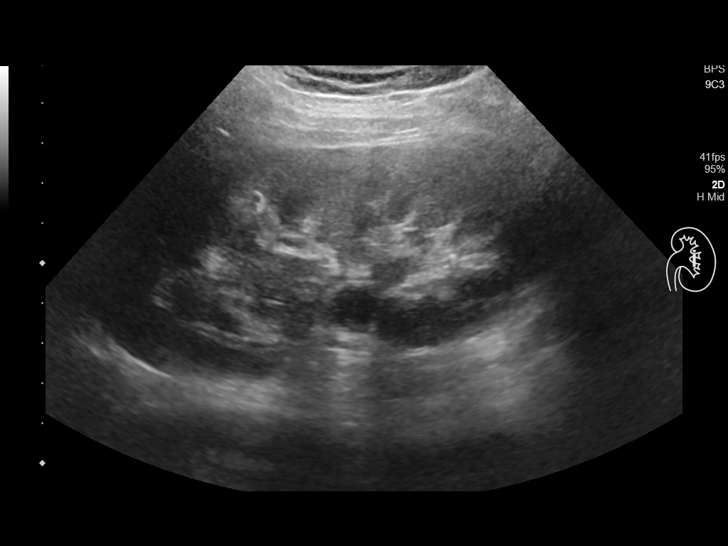
[im 31/58]
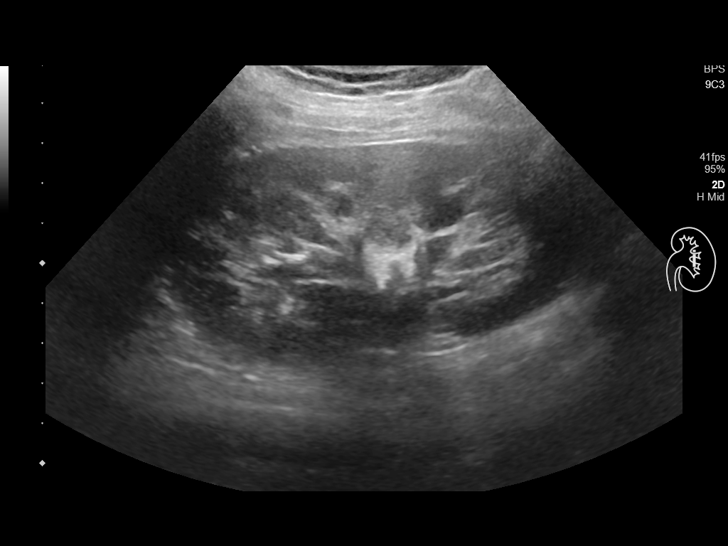
[im 36/58]
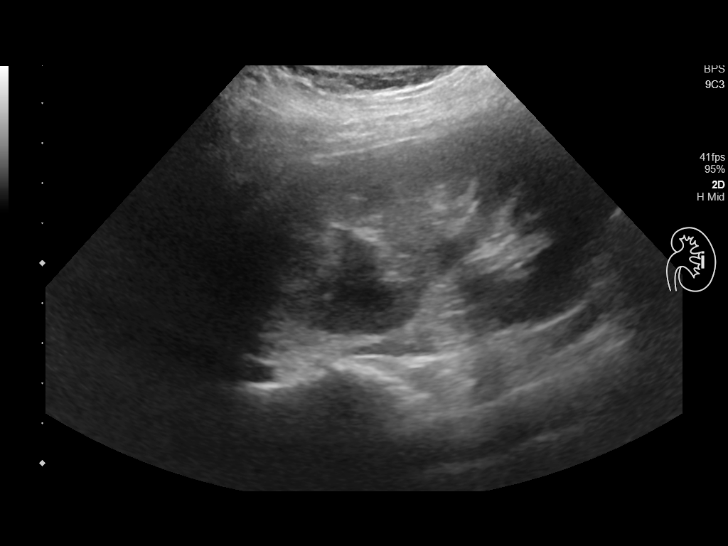
[im 39/58]
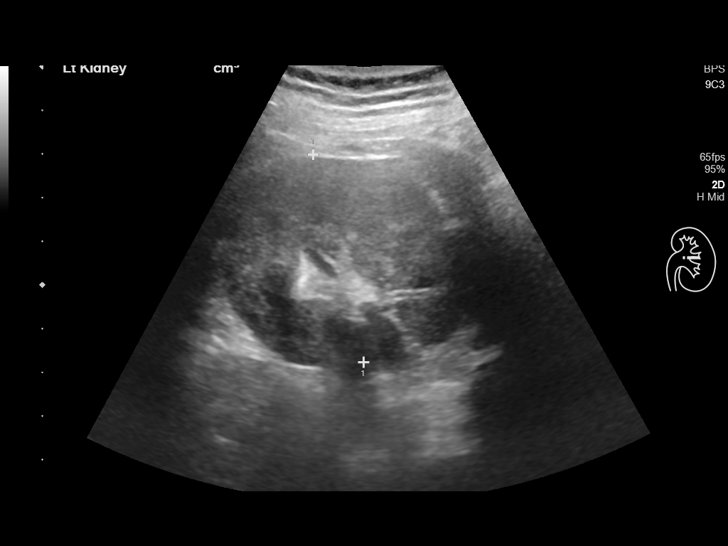
[im 43/58]
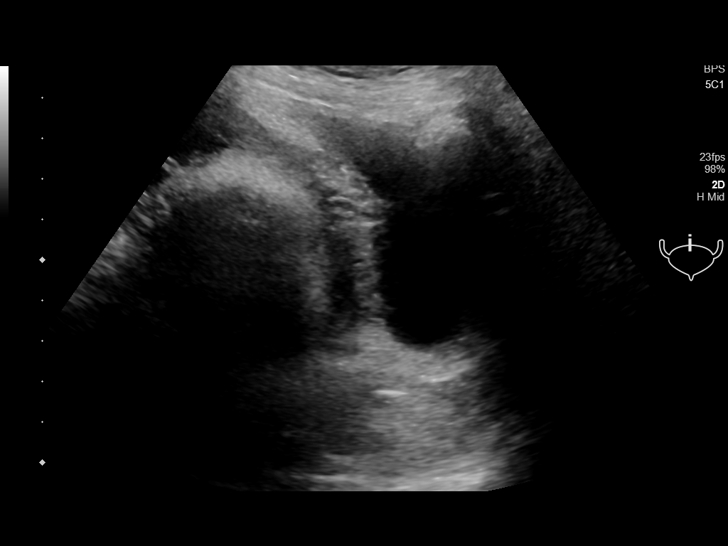
[im 48/58]
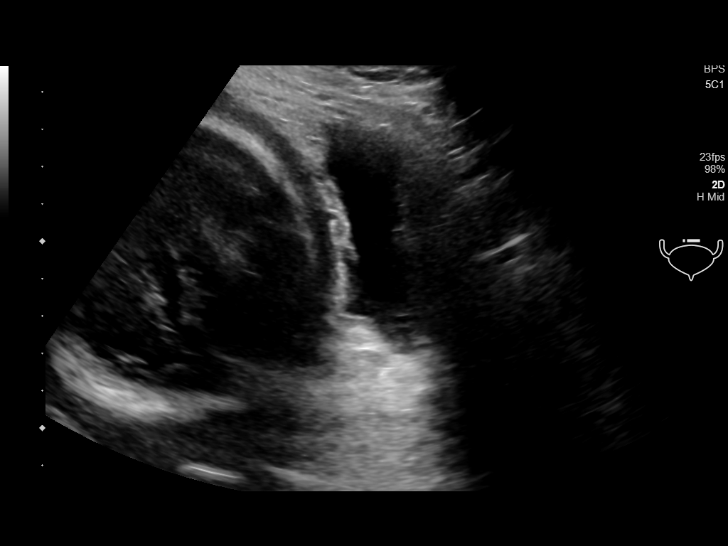
[im 53/58]
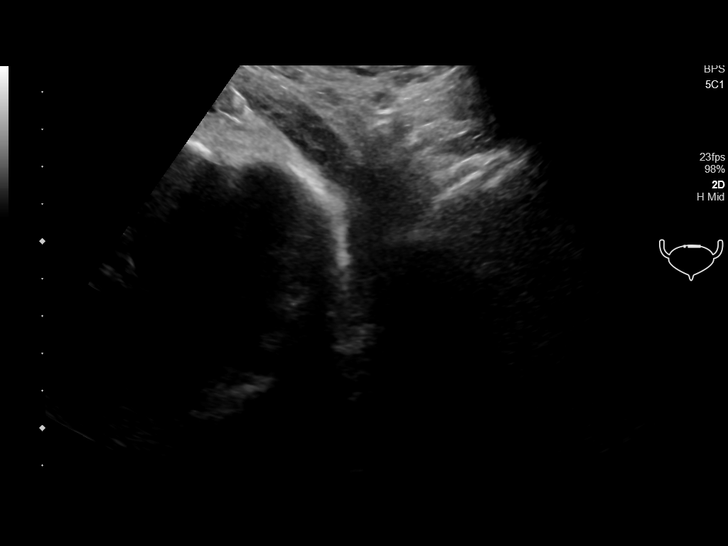
[im 58/58]
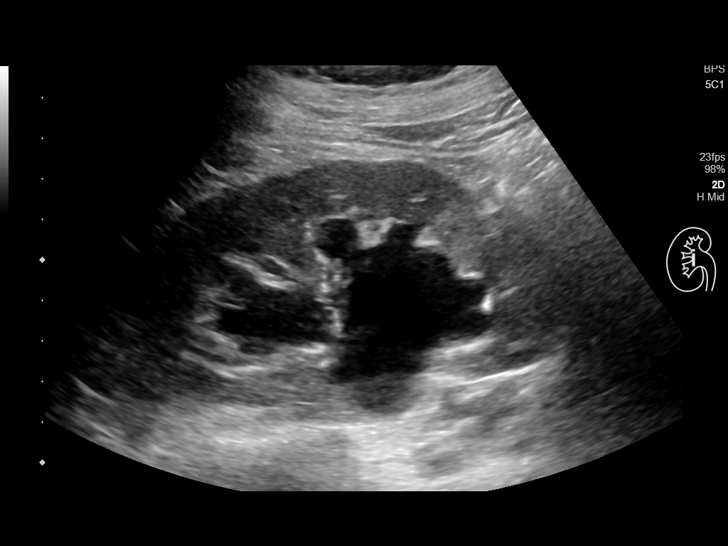

[14 of 25 positions shown; findings below may reference images not displayed]

FINDINGS: Right Kidney:

Renal measurements: 12.6 x 6.1 x 5.5 cm = volume: 221 mL. Severe
right hydronephrosis. Hydronephrosis persisted on post void imaging.
No renal mass or calculus identified.

Left Kidney:

Renal measurements: 11.1 x 5.7 x 4.9 cm = volume: 162 mL.
Echogenicity within normal limits. No mass or hydronephrosis
visualized.

Bladder:

Appears normal for degree of bladder distention. Right ureteral jet
noted in the bladder.

Gravid uterus noted
IMPRESSION: Severe right hydronephrosis. This may be due to pregnancy. Renal
stricture or stone also possible. Correlate with history of pain.

## 2020-05-03 DIAGNOSIS — Z419 Encounter for procedure for purposes other than remedying health state, unspecified: Secondary | ICD-10-CM | POA: Diagnosis not present

## 2020-06-03 DIAGNOSIS — Z419 Encounter for procedure for purposes other than remedying health state, unspecified: Secondary | ICD-10-CM | POA: Diagnosis not present

## 2020-06-11 DIAGNOSIS — J0100 Acute maxillary sinusitis, unspecified: Secondary | ICD-10-CM | POA: Diagnosis not present

## 2020-06-11 DIAGNOSIS — R05 Cough: Secondary | ICD-10-CM | POA: Diagnosis not present

## 2020-07-04 DIAGNOSIS — J3501 Chronic tonsillitis: Secondary | ICD-10-CM | POA: Diagnosis not present

## 2020-07-04 DIAGNOSIS — Z419 Encounter for procedure for purposes other than remedying health state, unspecified: Secondary | ICD-10-CM | POA: Diagnosis not present

## 2020-07-06 ENCOUNTER — Encounter: Payer: Self-pay | Admitting: Anesthesiology

## 2020-07-06 ENCOUNTER — Encounter: Payer: Self-pay | Admitting: Unknown Physician Specialty

## 2020-07-10 NOTE — Discharge Instructions (Signed)
T & A INSTRUCTION SHEET - [facility]  Beverly Gust, MD  [address] [place], [place] [address] TEL.  [phone]  INFORMATION SHEET FOR A TONSILLECTOMY AND ADENDOIDECTOMY  About Your Tonsils and Adenoids  The tonsils and adenoids are normal body tissues that are part of our immune system.  They normally help to protect [place] against diseases that may enter our mouth and nose. However, sometimes the tonsils and/or adenoids become too large and obstruct our breathing, especially at night.    If either of these things happen it helps to remove the tonsils and adenoids in order to become healthier. The operation to remove the tonsils and adenoids is called a tonsillectomy and adenoidectomy.  The Location of Your Tonsils and Adenoids  The tonsils are located in the back of the throat on both side and sit in a cradle of muscles. The adenoids are located in the roof of the mouth, behind the nose, and closely associated with the opening of the Eustachian tube to the ear.  Surgery on Tonsils and Adenoids  A tonsillectomy and adenoidectomy is a short operation which takes about thirty minutes.  This includes being put to sleep and being awakened. Tonsillectomies and adenoidectomies are performed at [facility] and may require observation period in the recovery room prior to going home. Children are required to remain in recovery for at least 45 minutes.   Following the Operation for a Tonsillectomy  A cautery machine is used to control bleeding. Bleeding from a tonsillectomy and adenoidectomy is minimal and postoperatively the risk of bleeding is approximately four percent, although this rarely life threatening.  After your tonsillectomy and adenoidectomy post-op care at home: 1. Our patients are able to go home the same day. You may be given prescriptions for pain medications, if indicated. 2. It is extremely important to  remember that fluid intake is of utmost importance after a tonsillectomy. The amount that you drink must be maintained in the postoperative period. A good indication of whether a child is getting enough fluid is whether his/her urine output is constant. As long as children are urinating or wetting their diaper every 6 - 8 hours this is usually enough fluid intake.   3. Although rare, this is a risk of some bleeding in the first ten days after surgery. This usually occurs between day five and nine postoperatively. This risk of bleeding is approximately four percent. If you or your child should have any bleeding you should remain calm and notify our office or go directly to the emergency room at [facility] where they will contact us. Our doctors are available seven days a week for notification. We recommend sitting up quietly in a chair, place an ice pack on the front of the neck and spitting out the blood gently until we are able to contact you. Adults should gargle gently with ice water and this may help stop the bleeding. If the bleeding does not stop after a short time, i.e. 10 to 15 minutes, or seems to be increasing again, please contact us or go to the hospital.   4. It is common for the pain to be worse at 5 - 7 days postoperatively. This occurs because the "scab" is peeling off and the mucous membrane (skin of the throat) is growing back where the tonsils were.   5. It is common for a low-grade fever, less than 102, during the first week  after a tonsillectomy and adenoidectomy. It is usually due to not drinking enough liquids, and we suggest your use liquid Tylenol (acetaminophen) or the pain medicine with Tylenol (acetaminophen) prescribed in order to keep your temperature below 102. Please follow the directions on the back of the bottle. 6. Recommendations for post-operative pain in children and adults: a) For Children 12 and younger: Recommendations are for oral Tylenol  (acetaminophen) and oral Motrin (Ibuprofen). Administer the Tylenol (acetaminophen) and Motrin (ibuprofen) as stated on bottle for patient's age/weight. Sometimes it may be necessary to alternate the Tylenol (acetaminophen) and Motrin for improved pain control. Motrin does last slightly longer so many patients benefit from being given this prior to bedtime. All children should avoid Aspirin products for 2 weeks following surgery. b) For children over the age of 12: Tylenol (acetaminophen) is the preferred first choice for pain control. Depending on your child's size, sometimes they will be given a combination of Tylenol (acetaminophen) and hydrocodone medication or sometimes it will be recommended they take Motrin (ibuprofen) in addition to the Tylenol (acetaminophen). Narcotics should always be used with caution in children following surgery as they can suppress their breathing and switching to over the counter Tylenol (acetaminophen) and Motrin (ibuprofen) as soon as possible is recommended. All patients should avoid Aspirin products for 2 weeks following surgery. c) Adults: Usually adults will require a narcotic pain medication following a tonsillectomy. This usually has either hydrocodone or oxycodone in it and can usually be taken every 4 to 6 hours as needed for moderate pain. If the medication does not have Tylenol (acetaminophen) in it, you may also supplement Tylenol (acetaminophen) as needed every 4 to 6 hours for breakthrough or mild pain. Adults should avoid Aspirin, Aleve, Motrin, and Ibuprofen products for 2 weeks following surgery as they can increase your risk of bleeding. 7. If you happen to look in the mirror or into your child's mouth you will see white/gray patches on the back of the throat. This is what a scab looks like in the mouth and is normal after having a tonsillectomy and adenoidectomy. They will disappear once the tonsil areas heal completely. However, it may cause a noticeable odor,  and this too will disappear with time.     8. You or your child may experience ear pain after having a tonsillectomy and adenoidectomy.  This is called referred pain and comes from the throat, but it is felt in the ears.  Ear pain is quite common and expected. It will usually go away after ten days. There is usually nothing wrong with the ears, and it is primarily due to the healing area stimulating the nerve to the ear that runs along the side of the throat. Use either the prescribed pain medicine or Tylenol (acetaminophen) as needed.  9. The throat tissues after a tonsillectomy are obviously sensitive. Smoking around children who have had a tonsillectomy significantly increases the risk of bleeding. DO NOT SMOKE!  General Anesthesia, Adult, Care After This sheet gives you information about how to care for yourself after your procedure. Your health care provider may also give you more specific instructions. If you have problems or questions, contact your health care provider. What can I expect after the procedure? After the procedure, the following side effects are common:  Pain or discomfort at the IV site.  Nausea.  Vomiting.  Sore throat.  Trouble concentrating.  Feeling cold or chills.  Weak or tired.  Sleepiness and fatigue.  Soreness and body aches.   These side effects can affect parts of the body that were not involved in surgery. Follow these instructions at home:  For at least 24 hours after the procedure:  Have a responsible adult stay with you. It is important to have someone help care for you until you are awake and alert.  Rest as needed.  Do not: ? Participate in activities in which you could fall or become injured. ? Drive. ? Use heavy machinery. ? Drink alcohol. ? Take sleeping pills or medicines that cause drowsiness. ? Make important decisions or sign legal documents. ? Take care of children on your own. Eating and drinking  Follow any instructions from  your health care provider about eating or drinking restrictions.  When you feel hungry, start by eating small amounts of foods that are soft and easy to digest (bland), such as toast. Gradually return to your regular diet.  Drink enough fluid to keep your urine pale yellow.  If you vomit, rehydrate by drinking water, juice, or clear broth. General instructions  If you have sleep apnea, surgery and certain medicines can increase your risk for breathing problems. Follow instructions from your health care provider about wearing your sleep device: ? Anytime you are sleeping, including during daytime naps. ? While taking prescription pain medicines, sleeping medicines, or medicines that make you drowsy.  Return to your normal activities as told by your health care provider. Ask your health care provider what activities are safe for you.  Take over-the-counter and prescription medicines only as told by your health care provider.  If you smoke, do not smoke without supervision.  Keep all follow-up visits as told by your health care provider. This is important. Contact a health care provider if:  You have nausea or vomiting that does not get better with medicine.  You cannot eat or drink without vomiting.  You have pain that does not get better with medicine.  You are unable to pass urine.  You develop a skin rash.  You have a fever.  You have redness around your IV site that gets worse. Get help right away if:  You have difficulty breathing.  You have chest pain.  You have blood in your urine or stool, or you vomit blood. Summary  After the procedure, it is common to have a sore throat or nausea. It is also common to feel tired.  Have a responsible adult stay with you for the first 24 hours after general anesthesia. It is important to have someone help care for you until you are awake and alert.  When you feel hungry, start by eating small amounts of foods that are soft and  easy to digest (bland), such as toast. Gradually return to your regular diet.  Drink enough fluid to keep your urine pale yellow.  Return to your normal activities as told by your health care provider. Ask your health care provider what activities are safe for you. This information is not intended to replace advice given to you by your health care provider. Make sure you discuss any questions you have with your health care provider. Document Revised: 10/23/2017 Document Reviewed: 06/05/2017 Elsevier Patient Education  2020 [employer]vier Inc[employer]  

## 2020-07-11 ENCOUNTER — Other Ambulatory Visit: Payer: Self-pay

## 2020-07-11 ENCOUNTER — Other Ambulatory Visit
Admission: RE | Admit: 2020-07-11 | Discharge: 2020-07-11 | Disposition: A | Discharge status: Home / Self Care | Payer: Medicaid Other | Source: Ambulatory Visit | Attending: Unknown Physician Specialty | Admitting: Unknown Physician Specialty

## 2020-07-11 DIAGNOSIS — Z01812 Encounter for preprocedural laboratory examination: Secondary | ICD-10-CM | POA: Diagnosis not present

## 2020-07-11 DIAGNOSIS — Z20822 Contact with and (suspected) exposure to covid-19: Secondary | ICD-10-CM | POA: Insufficient documentation

## 2020-07-11 LAB — SARS CORONAVIRUS 2 (TAT 6-24 HRS): SARS Coronavirus 2: NEGATIVE

## 2020-07-12 ENCOUNTER — Encounter: Payer: Self-pay | Admitting: Unknown Physician Specialty

## 2020-07-13 ENCOUNTER — Ambulatory Visit
Admission: RE | Admit: 2020-07-13 | Payer: Medicaid Other | Source: Home / Self Care | Admitting: Unknown Physician Specialty

## 2020-07-13 SURGERY — TONSILLECTOMY
Anesthesia: General | Laterality: Bilateral

## 2020-07-26 ENCOUNTER — Other Ambulatory Visit: Payer: Self-pay

## 2020-07-26 ENCOUNTER — Encounter: Payer: Self-pay | Admitting: Unknown Physician Specialty

## 2020-07-27 ENCOUNTER — Encounter: Payer: Self-pay | Admitting: Anesthesiology

## 2020-08-01 ENCOUNTER — Other Ambulatory Visit: Payer: Self-pay

## 2020-08-01 ENCOUNTER — Other Ambulatory Visit
Admission: RE | Admit: 2020-08-01 | Discharge: 2020-08-01 | Disposition: A | Discharge status: Home / Self Care | Payer: Medicaid Other | Source: Ambulatory Visit | Attending: Unknown Physician Specialty | Admitting: Unknown Physician Specialty

## 2020-08-01 DIAGNOSIS — Z20822 Contact with and (suspected) exposure to covid-19: Secondary | ICD-10-CM | POA: Insufficient documentation

## 2020-08-01 DIAGNOSIS — Z01812 Encounter for preprocedural laboratory examination: Secondary | ICD-10-CM | POA: Diagnosis not present

## 2020-08-01 LAB — SARS CORONAVIRUS 2 (TAT 6-24 HRS): SARS Coronavirus 2: NEGATIVE

## 2020-08-02 NOTE — Discharge Instructions (Signed)
T & A INSTRUCTION SHEET - [facility]  Beverly Gust, MD  [address] [place], [place] [address] TEL.  [phone]  INFORMATION SHEET FOR A TONSILLECTOMY AND ADENDOIDECTOMY  About Your Tonsils and Adenoids  The tonsils and adenoids are normal body tissues that are part of our immune system.  They normally help to protect [place] against diseases that may enter our mouth and nose. However, sometimes the tonsils and/or adenoids become too large and obstruct our breathing, especially at night.    If either of these things happen it helps to remove the tonsils and adenoids in order to become healthier. The operation to remove the tonsils and adenoids is called a tonsillectomy and adenoidectomy.  The Location of Your Tonsils and Adenoids  The tonsils are located in the back of the throat on both side and sit in a cradle of muscles. The adenoids are located in the roof of the mouth, behind the nose, and closely associated with the opening of the Eustachian tube to the ear.  Surgery on Tonsils and Adenoids  A tonsillectomy and adenoidectomy is a short operation which takes about thirty minutes.  This includes being put to sleep and being awakened. Tonsillectomies and adenoidectomies are performed at [facility] and may require observation period in the recovery room prior to going home. Children are required to remain in recovery for at least 45 minutes.   Following the Operation for a Tonsillectomy  A cautery machine is used to control bleeding. Bleeding from a tonsillectomy and adenoidectomy is minimal and postoperatively the risk of bleeding is approximately four percent, although this rarely life threatening.  After your tonsillectomy and adenoidectomy post-op care at home: 1. Our patients are able to go home the same day. You may be given prescriptions for pain medications, if indicated. 2. It is extremely important to  remember that fluid intake is of utmost importance after a tonsillectomy. The amount that you drink must be maintained in the postoperative period. A good indication of whether a child is getting enough fluid is whether his/her urine output is constant. As long as children are urinating or wetting their diaper every 6 - 8 hours this is usually enough fluid intake.   3. Although rare, this is a risk of some bleeding in the first ten days after surgery. This usually occurs between day five and nine postoperatively. This risk of bleeding is approximately four percent. If you or your child should have any bleeding you should remain calm and notify our office or go directly to the emergency room at [facility] where they will contact us. Our doctors are available seven days a week for notification. We recommend sitting up quietly in a chair, place an ice pack on the front of the neck and spitting out the blood gently until we are able to contact you. Adults should gargle gently with ice water and this may help stop the bleeding. If the bleeding does not stop after a short time, i.e. 10 to 15 minutes, or seems to be increasing again, please contact us or go to the hospital.   4. It is common for the pain to be worse at 5 - 7 days postoperatively. This occurs because the "scab" is peeling off and the mucous membrane (skin of the throat) is growing back where the tonsils were.   5. It is common for a low-grade fever, less than 102, during the first week  after a tonsillectomy and adenoidectomy. It is usually due to not drinking enough liquids, and we suggest your use liquid Tylenol (acetaminophen) or the pain medicine with Tylenol (acetaminophen) prescribed in order to keep your temperature below 102. Please follow the directions on the back of the bottle. 6. Recommendations for post-operative pain in children and adults: a) For Children 12 and younger: Recommendations are for oral Tylenol  (acetaminophen) and oral Motrin (Ibuprofen). Administer the Tylenol (acetaminophen) and Motrin (ibuprofen) as stated on bottle for patient's age/weight. Sometimes it may be necessary to alternate the Tylenol (acetaminophen) and Motrin for improved pain control. Motrin does last slightly longer so many patients benefit from being given this prior to bedtime. All children should avoid Aspirin products for 2 weeks following surgery. b) For children over the age of 12: Tylenol (acetaminophen) is the preferred first choice for pain control. Depending on your child's size, sometimes they will be given a combination of Tylenol (acetaminophen) and hydrocodone medication or sometimes it will be recommended they take Motrin (ibuprofen) in addition to the Tylenol (acetaminophen). Narcotics should always be used with caution in children following surgery as they can suppress their breathing and switching to over the counter Tylenol (acetaminophen) and Motrin (ibuprofen) as soon as possible is recommended. All patients should avoid Aspirin products for 2 weeks following surgery. c) Adults: Usually adults will require a narcotic pain medication following a tonsillectomy. This usually has either hydrocodone or oxycodone in it and can usually be taken every 4 to 6 hours as needed for moderate pain. If the medication does not have Tylenol (acetaminophen) in it, you may also supplement Tylenol (acetaminophen) as needed every 4 to 6 hours for breakthrough or mild pain. Adults should avoid Aspirin, Aleve, Motrin, and Ibuprofen products for 2 weeks following surgery as they can increase your risk of bleeding. 7. If you happen to look in the mirror or into your child's mouth you will see white/gray patches on the back of the throat. This is what a scab looks like in the mouth and is normal after having a tonsillectomy and adenoidectomy. They will disappear once the tonsil areas heal completely. However, it may cause a noticeable odor,  and this too will disappear with time.     8. You or your child may experience ear pain after having a tonsillectomy and adenoidectomy.  This is called referred pain and comes from the throat, but it is felt in the ears.  Ear pain is quite common and expected. It will usually go away after ten days. There is usually nothing wrong with the ears, and it is primarily due to the healing area stimulating the nerve to the ear that runs along the side of the throat. Use either the prescribed pain medicine or Tylenol (acetaminophen) as needed.  9. The throat tissues after a tonsillectomy are obviously sensitive. Smoking around children who have had a tonsillectomy significantly increases the risk of bleeding. DO NOT SMOKE!  General Anesthesia, Adult, Care After This sheet gives you information about how to care for yourself after your procedure. Your health care provider may also give you more specific instructions. If you have problems or questions, contact your health care provider. What can I expect after the procedure? After the procedure, the following side effects are common:  Pain or discomfort at the IV site.  Nausea.  Vomiting.  Sore throat.  Trouble concentrating.  Feeling cold or chills.  Weak or tired.  Sleepiness and fatigue.  Soreness and body aches.   These side effects can affect parts of the body that were not involved in surgery. Follow these instructions at home:  For at least 24 hours after the procedure:  Have a responsible adult stay with you. It is important to have someone help care for you until you are awake and alert.  Rest as needed.  Do not: ? Participate in activities in which you could fall or become injured. ? Drive. ? Use heavy machinery. ? Drink alcohol. ? Take sleeping pills or medicines that cause drowsiness. ? Make important decisions or sign legal documents. ? Take care of children on your own. Eating and drinking  Follow any instructions from  your health care provider about eating or drinking restrictions.  When you feel hungry, start by eating small amounts of foods that are soft and easy to digest (bland), such as toast. Gradually return to your regular diet.  Drink enough fluid to keep your urine pale yellow.  If you vomit, rehydrate by drinking water, juice, or clear broth. General instructions  If you have sleep apnea, surgery and certain medicines can increase your risk for breathing problems. Follow instructions from your health care provider about wearing your sleep device: ? Anytime you are sleeping, including during daytime naps. ? While taking prescription pain medicines, sleeping medicines, or medicines that make you drowsy.  Return to your normal activities as told by your health care provider. Ask your health care provider what activities are safe for you.  Take over-the-counter and prescription medicines only as told by your health care provider.  If you smoke, do not smoke without supervision.  Keep all follow-up visits as told by your health care provider. This is important. Contact a health care provider if:  You have nausea or vomiting that does not get better with medicine.  You cannot eat or drink without vomiting.  You have pain that does not get better with medicine.  You are unable to pass urine.  You develop a skin rash.  You have a fever.  You have redness around your IV site that gets worse. Get help right away if:  You have difficulty breathing.  You have chest pain.  You have blood in your urine or stool, or you vomit blood. Summary  After the procedure, it is common to have a sore throat or nausea. It is also common to feel tired.  Have a responsible adult stay with you for the first 24 hours after general anesthesia. It is important to have someone help care for you until you are awake and alert.  When you feel hungry, start by eating small amounts of foods that are soft and  easy to digest (bland), such as toast. Gradually return to your regular diet.  Drink enough fluid to keep your urine pale yellow.  Return to your normal activities as told by your health care provider. Ask your health care provider what activities are safe for you. This information is not intended to replace advice given to you by your health care provider. Make sure you discuss any questions you have with your health care provider. Document Revised: 10/23/2017 Document Reviewed: 06/05/2017 Elsevier Patient Education  2020 [employer]vier Inc[employer]  

## 2020-08-03 ENCOUNTER — Other Ambulatory Visit: Payer: Self-pay

## 2020-08-03 ENCOUNTER — Ambulatory Visit
Admission: RE | Admit: 2020-08-03 | Discharge: 2020-08-03 | Disposition: A | Discharge status: Home / Self Care | Payer: Medicaid Other | Attending: Unknown Physician Specialty | Admitting: Unknown Physician Specialty

## 2020-08-03 ENCOUNTER — Encounter
Admission: RE | Disposition: A | Discharge status: Home / Self Care | Payer: Self-pay | Source: Home / Self Care | Attending: Unknown Physician Specialty

## 2020-08-03 ENCOUNTER — Encounter: Payer: Self-pay | Admitting: Unknown Physician Specialty

## 2020-08-03 DIAGNOSIS — Z3201 Encounter for pregnancy test, result positive: Secondary | ICD-10-CM | POA: Insufficient documentation

## 2020-08-03 DIAGNOSIS — Z419 Encounter for procedure for purposes other than remedying health state, unspecified: Secondary | ICD-10-CM | POA: Diagnosis not present

## 2020-08-03 HISTORY — PX: TONSILLECTOMY AND ADENOIDECTOMY: SHX28

## 2020-08-03 LAB — POCT PREGNANCY, URINE: Preg Test, Ur: POSITIVE — AB

## 2020-08-03 SURGERY — TONSILLECTOMY AND ADENOIDECTOMY
Anesthesia: General | Laterality: Bilateral

## 2020-08-03 MED ORDER — ACETAMINOPHEN 10 MG/ML IV SOLN: 1000.0000 mg | Freq: Once | INTRAVENOUS | Status: DC

## 2020-08-03 MED ORDER — SCOPOLAMINE 1 MG/3DAYS TD PT72: 1.0000 | MEDICATED_PATCH | Freq: Once | TRANSDERMAL | Status: DC

## 2020-08-03 MED ORDER — LACTATED RINGERS IV SOLN: INTRAVENOUS | Status: DC

## 2020-08-03 SURGICAL SUPPLY — 19 items
CANISTER SUCT 1200ML W/VALVE (MISCELLANEOUS) ×3 IMPLANT
CATH RUBBER RED 8F (CATHETERS) ×3 IMPLANT
COAG SUCT 10F 3.5MM HAND CTRL (MISCELLANEOUS) ×3 IMPLANT
DRAPE HEAD BAR (DRAPES) ×3 IMPLANT
ELECT CAUTERY BLADE TIP 2.5 (TIP) ×3
ELECT REM PT RETURN 9FT ADLT (ELECTROSURGICAL) ×3
ELECTRODE CAUTERY BLDE TIP 2.5 (TIP) ×1 IMPLANT
ELECTRODE REM PT RTRN 9FT ADLT (ELECTROSURGICAL) ×1 IMPLANT
GLOVE BIO SURGEON STRL SZ7.5 (GLOVE) ×3 IMPLANT
KIT TURNOVER KIT A (KITS) ×3 IMPLANT
NEEDLE HYPO 25GX1X1/2 BEV (NEEDLE) ×3 IMPLANT
NS IRRIG 500ML POUR BTL (IV SOLUTION) ×3 IMPLANT
PACK TONSIL AND ADENOID CUSTOM (PACKS) ×3 IMPLANT
PENCIL ELECTRO HAND CTR (MISCELLANEOUS) ×3 IMPLANT
SOL ANTI-FOG 6CC FOG-OUT (MISCELLANEOUS) ×1 IMPLANT
SOL FOG-OUT ANTI-FOG 6CC (MISCELLANEOUS) ×2
SPONGE TONSIL .75 RFD DBL STRL (DISPOSABLE) ×3 IMPLANT
STRAP BODY AND KNEE 60X3 (MISCELLANEOUS) ×3 IMPLANT
SYR 10ML LL (SYRINGE) ×3 IMPLANT

## 2020-08-03 NOTE — Progress Notes (Signed)
Patient's urine pregnancy test was positive and therefore anesthesia made the decision to cancel elective procedure.

## 2020-08-06 ENCOUNTER — Encounter: Payer: Self-pay | Admitting: Unknown Physician Specialty

## 2020-08-07 ENCOUNTER — Encounter: Payer: Self-pay | Admitting: Certified Registered"

## 2020-08-24 ENCOUNTER — Ambulatory Visit: Payer: Medicaid Other | Admitting: Obstetrics and Gynecology

## 2020-08-28 ENCOUNTER — Encounter: Payer: Self-pay | Admitting: Unknown Physician Specialty

## 2020-09-03 DIAGNOSIS — Z419 Encounter for procedure for purposes other than remedying health state, unspecified: Secondary | ICD-10-CM | POA: Diagnosis not present

## 2020-09-10 ENCOUNTER — Ambulatory Visit (INDEPENDENT_AMBULATORY_CARE_PROVIDER_SITE_OTHER): Payer: Medicaid Other | Admitting: Obstetrics and Gynecology

## 2020-09-10 ENCOUNTER — Other Ambulatory Visit: Payer: Self-pay

## 2020-09-10 ENCOUNTER — Encounter: Payer: Self-pay | Admitting: Obstetrics and Gynecology

## 2020-09-10 VITALS — BP 118/74 | Wt 118.0 lb

## 2020-09-10 DIAGNOSIS — Z3009 Encounter for other general counseling and advice on contraception: Secondary | ICD-10-CM | POA: Diagnosis not present

## 2020-09-10 NOTE — Progress Notes (Signed)
Obstetrics & Gynecology Office Visit   Chief Complaint  Patient presents with  . Consult  Contraception  History of Present Illness: Patient is a 24 y.o. [id number] presenting for contraception consult.  She is currently on OCP (estrogen/progesterone) and desiring to start Depo-Provera injections.  She has a past medical history significant for no contraindication to estrogen.  She specifically denies a history of migraine with aura, chronic hypertension, history of DVT/PE and smoking.  Reported Patient's last menstrual period was 08/10/2020.Marland Kitchen      Past Medical History:  Diagnosis Date  . Frequent headaches   . History of chlamydia   . Irregular menses   . SAB (spontaneous abortion) 11/2016    Past Surgical History:  Procedure Laterality Date  . HAND SURGERY Right 06/2016  . TONSILLECTOMY AND ADENOIDECTOMY Bilateral 08/03/2020   Procedure: TONSILLECTOMY AND possible ADENOIDECTOMY;  Surgeon: Beverly Gust, MD;  Location: [facility];  Service: ENT;  Laterality: Bilateral;  . WISDOM TOOTH EXTRACTION      Gynecologic History: Patient's last menstrual period was 08/10/2020.  Obstetric History: [id number]  Family History  Problem Relation Age of Onset  . Headache Mother   . Ovarian cysts Mother   . Headache Sister     Social History   Socioeconomic History  . Marital status: Single    Spouse name: Not on file  . Number of children: Not on file  . Years of education: Not on file  . Highest education level: Not on file  Occupational History  . Not on file  Tobacco Use  . Smoking status: Former Smoker    Packs/day: 0.25    Types: Cigarettes  . Smokeless tobacco: Never Used  Vaping Use  . Vaping Use: Never used  Substance and Sexual Activity  . Alcohol use: Not Currently    Comment: social  . Drug use: No  . Sexual activity: Yes    Partners: Male    Birth control/protection: I.U.D.    Comment: Mirena  Other Topics Concern  . Not on file  Social History  Narrative   Single.   Works for [employer].   Enjoys riding her horses.    Social Determinants of Health   Financial Resource Strain:   . Difficulty of Paying Living Expenses: Not on file  Food Insecurity:   . Worried About Charity fundraiser in the Last Year: Not on file  . Ran Out of Food in the Last Year: Not on file  Transportation Needs:   . Lack of Transportation (Medical): Not on file  . Lack of Transportation (Non-Medical): Not on file  Physical Activity:   . Days of Exercise per Week: Not on file  . Minutes of Exercise per Session: Not on file  Stress:   . Feeling of Stress : Not on file  Social Connections:   . Frequency of Communication with Friends and Family: Not on file  . Frequency of Social Gatherings with Friends and Family: Not on file  . Attends Religious Services: Not on file  . Active Member of Clubs or Organizations: Not on file  . Attends Archivist Meetings: Not on file  . Marital Status: Not on file  Intimate Partner Violence:   . Fear of Current or Ex-Partner: Not on file  . Emotionally Abused: Not on file  . Physically Abused: Not on file  . Sexually Abused: Not on file    No Known Allergies  Prior to Admission medications   Medication Sig Start Date  End Date Taking? Authorizing Provider  [name] 1/20 1-20 MG-MCG tablet TAKE 1 TABLET BY MOUTH EVERY DAY Patient not taking: Reported on 07/06/2020 0/98/11   Copland, Deirdre Evener, PA-C    Review of Systems  Constitutional: Negative.   HENT: Negative.   Eyes: Negative.   Respiratory: Negative.   Cardiovascular: Negative.   Gastrointestinal: Negative.   Genitourinary: Negative.   Musculoskeletal: Negative.   Skin: Negative.   Neurological: Negative.   Psychiatric/Behavioral: Negative.      Physical Exam BP 118/74   Wt 118 lb (53.5 kg)   LMP 08/10/2020   BMI 22.30 kg/m  Patient's last menstrual period was 08/10/2020. Physical Exam Constitutional:      General: She is  not in acute distress.    Appearance: Normal appearance.  HENT:     Head: Normocephalic and atraumatic.  Eyes:     General: No scleral icterus.    Conjunctiva/sclera: Conjunctivae normal.  Neurological:     General: No focal deficit present.     Mental Status: She is alert and oriented to person, place, and time.     Cranial Nerves: No cranial nerve deficit.  Psychiatric:        Mood and Affect: Mood normal.        Behavior: Behavior normal.        Judgment: Judgment normal.     Female chaperone present for pelvic and breast  portions of the physical exam  Assessment: 24 y.o. [id number] female here for  1. Encounter for other general counseling or advice on contraception      Plan: Problem List Items Addressed This Visit    None    Visit Diagnoses    Encounter for other general counseling or advice on contraception    -  Primary     Reviewed all forms of birth control options available including abstinence; over the counter/barrier methods; hormonal contraceptive medication including pill, patch, ring, injection,contraceptive implant; hormonal and nonhormonal IUDs; permanent sterilization options including vasectomy and the various tubal sterilization modalities. Risks and benefits reviewed.  Questions were answered.  She will consider and let me know.  A total of 30 minutes were spent face-to-face with the patient as well as preparation, review, communication, and documentation during this encounter.    Prentice Docker, MD 09/10/2020 6:08 PM

## 2020-10-03 ENCOUNTER — Encounter: Payer: Self-pay | Admitting: Obstetrics and Gynecology

## 2020-10-03 ENCOUNTER — Other Ambulatory Visit: Payer: Self-pay

## 2020-10-03 ENCOUNTER — Other Ambulatory Visit (HOSPITAL_COMMUNITY)
Admission: RE | Admit: 2020-10-03 | Discharge: 2020-10-03 | Disposition: A | Discharge status: Home / Self Care | Payer: Medicaid Other | Source: Ambulatory Visit | Attending: Obstetrics and Gynecology | Admitting: Obstetrics and Gynecology

## 2020-10-03 ENCOUNTER — Ambulatory Visit (INDEPENDENT_AMBULATORY_CARE_PROVIDER_SITE_OTHER): Payer: Medicaid Other | Admitting: Obstetrics and Gynecology

## 2020-10-03 VITALS — BP 109/72 | Ht 61.0 in | Wt 116.0 lb

## 2020-10-03 DIAGNOSIS — Z113 Encounter for screening for infections with a predominantly sexual mode of transmission: Secondary | ICD-10-CM | POA: Insufficient documentation

## 2020-10-03 DIAGNOSIS — B373 Candidiasis of vulva and vagina: Secondary | ICD-10-CM

## 2020-10-03 DIAGNOSIS — B3731 Acute candidiasis of vulva and vagina: Secondary | ICD-10-CM

## 2020-10-03 DIAGNOSIS — Z30016 Encounter for initial prescription of transdermal patch hormonal contraceptive device: Secondary | ICD-10-CM | POA: Diagnosis not present

## 2020-10-03 DIAGNOSIS — Z419 Encounter for procedure for purposes other than remedying health state, unspecified: Secondary | ICD-10-CM | POA: Diagnosis not present

## 2020-10-03 MED ORDER — XULANE 150-35 MCG/24HR TD PTWK: 1.0000 | MEDICATED_PATCH | TRANSDERMAL | 12 refills | Status: DC

## 2020-10-03 MED ORDER — FLUCONAZOLE 150 MG PO TABS: 150.0000 mg | ORAL_TABLET | Freq: Once | ORAL | 0 refills | Status: AC

## 2020-10-03 NOTE — Progress Notes (Signed)
Obstetrics & Gynecology Office Visit   Chief Complaint  Patient presents with  . Gynecologic Exam    yeast infection   History of Present Illness: Vaginitis: Patient complains of an abnormal vaginal discharge for 2 day. Vaginal symptoms include discharge described as white, curd-like and malodorous, local irritation, odor and vulvar itching.Vulvar symptoms include none.STI Risk: Possible STD exposure. .Other associated symptoms: none.Menstrual pattern: She had been bleeding regularly. Contraception: none. She already tried a dose of Monistat.  She would like to start the contraceptive patch.   Past Medical History:  Diagnosis Date  . Frequent headaches   . History of chlamydia   . Irregular menses   . SAB (spontaneous abortion) 11/2016   Past Surgical History:  Procedure Laterality Date  . HAND SURGERY Right 06/2016  . TONSILLECTOMY AND ADENOIDECTOMY Bilateral 08/03/2020   Procedure: TONSILLECTOMY AND possible ADENOIDECTOMY;  Surgeon: Beverly Gust, MD;  Location: [facility];  Service: ENT;  Laterality: Bilateral;  . WISDOM TOOTH EXTRACTION     Gynecologic History: Patient's last menstrual period was 09/29/2020.  Obstetric History: [id number]  Family History  Problem Relation Age of Onset  . Headache Mother   . Ovarian cysts Mother   . Headache Sister     Social History   Socioeconomic History  . Marital status: Single    Spouse name: Not on file  . Number of children: Not on file  . Years of education: Not on file  . Highest education level: Not on file  Occupational History  . Not on file  Tobacco Use  . Smoking status: Former Smoker    Packs/day: 0.25    Types: Cigarettes  . Smokeless tobacco: Never Used  Vaping Use  . Vaping Use: Never used  Substance and Sexual Activity  . Alcohol use: Not Currently    Comment: social  . Drug use: No  . Sexual activity: Yes    Partners: Male    Birth control/protection: I.U.D.    Comment: Mirena  Other  Topics Concern  . Not on file  Social History Narrative   Single.   Works for [employer].   Enjoys riding her horses.    Social Determinants of Health   Financial Resource Strain:   . Difficulty of Paying Living Expenses: Not on file  Food Insecurity:   . Worried About Charity fundraiser in the Last Year: Not on file  . Ran Out of Food in the Last Year: Not on file  Transportation Needs:   . Lack of Transportation (Medical): Not on file  . Lack of Transportation (Non-Medical): Not on file  Physical Activity:   . Days of Exercise per Week: Not on file  . Minutes of Exercise per Session: Not on file  Stress:   . Feeling of Stress : Not on file  Social Connections:   . Frequency of Communication with Friends and Family: Not on file  . Frequency of Social Gatherings with Friends and Family: Not on file  . Attends Religious Services: Not on file  . Active Member of Clubs or Organizations: Not on file  . Attends Archivist Meetings: Not on file  . Marital Status: Not on file  Intimate Partner Violence:   . Fear of Current or Ex-Partner: Not on file  . Emotionally Abused: Not on file  . Physically Abused: Not on file  . Sexually Abused: Not on file   Allergies: No Known Allergies  Prior to Admission medications   Medication Sig Start  Date End Date Taking? Authorizing Provider  [name] 1/20 1-20 MG-MCG tablet TAKE 1 TABLET BY MOUTH EVERY DAY Patient not taking: Reported on 07/06/2020 4/78/29   Copland, Deirdre Evener, PA-C    Review of Systems  Constitutional: Negative.   HENT: Negative.   Eyes: Negative.   Respiratory: Negative.   Cardiovascular: Negative.   Gastrointestinal: Negative.   Genitourinary: Negative.   Musculoskeletal: Negative.   Skin: Negative.   Neurological: Negative.   Psychiatric/Behavioral: Negative.      Physical Exam BP 109/72   Ht 5\' 1"  (1.549 m)   Wt 116 lb (52.6 kg)   LMP 09/29/2020   BMI 21.92 kg/m  Patient's last  menstrual period was 09/29/2020. Physical Exam Constitutional:      General: She is not in acute distress.    Appearance: Normal appearance. She is well-developed.  Genitourinary:     Pelvic exam was performed with patient in the lithotomy position.     Vulva, inguinal canal, urethra, bladder, vagina, uterus, right adnexa and left adnexa normal.     No posterior fourchette tenderness, injury or lesion present.     No cervical friability, lesion, bleeding or polyp.  HENT:     Head: Normocephalic and atraumatic.  Eyes:     General: No scleral icterus.    Conjunctiva/sclera: Conjunctivae normal.  Cardiovascular:     Rate and Rhythm: Normal rate and regular rhythm.     Heart sounds: No murmur heard.  No friction rub. No gallop.   Pulmonary:     Effort: Pulmonary effort is normal. No respiratory distress.     Breath sounds: Normal breath sounds. No wheezing or rales.  Abdominal:     General: Bowel sounds are normal. There is no distension.     Palpations: Abdomen is soft. There is no mass.     Tenderness: There is no abdominal tenderness. There is no guarding or rebound.  Musculoskeletal:        General: Normal range of motion.     Cervical back: Normal range of motion and neck supple.  Neurological:     General: No focal deficit present.     Mental Status: She is alert and oriented to person, place, and time.     Cranial Nerves: No cranial nerve deficit.  Skin:    General: Skin is warm and dry.     Findings: No erythema.  Psychiatric:        Mood and Affect: Mood normal.        Behavior: Behavior normal.        Judgment: Judgment normal.    Female chaperone present for pelvic and breast  portions of the physical exam  Wet Prep: PH: 5 Clue Cells: Negative Fungal elements: Positive Trichomonas: Negative   Assessment: 24 y.o. F6O1308 female here for  1. Vulvovaginal candidiasis   2. Screen for STD (sexually transmitted disease)   3. Encounter for initial prescription of  transdermal patch hormonal contraceptive device      Plan: Problem List Items Addressed This Visit    None    Visit Diagnoses    Vulvovaginal candidiasis    -  Primary   Relevant Medications   fluconazole (DIFLUCAN) 150 MG tablet   Screen for STD (sexually transmitted disease)       Relevant Orders   Cervicovaginal ancillary only   Encounter for initial prescription of transdermal patch hormonal contraceptive device       Relevant Medications   norelgestromin-ethinyl estradiol Marilu Favre) 150-35 MCG/24HR  transdermal patch     Will treat for candidiasis.  Test for STDs requested and sent. Start patch with next menses given timing of last menstruation.   Prentice Docker, MD 10/03/2020 2:46 PM

## 2020-10-03 NOTE — Progress Notes (Signed)
Pt states she has a yeast infection x 3 days. Pt states she has white discharge.

## 2020-10-05 ENCOUNTER — Other Ambulatory Visit: Payer: Self-pay | Admitting: Obstetrics

## 2020-10-05 DIAGNOSIS — B3731 Acute candidiasis of vulva and vagina: Secondary | ICD-10-CM

## 2020-10-05 LAB — CERVICOVAGINAL ANCILLARY ONLY
Chlamydia: NEGATIVE
Comment: NEGATIVE
Comment: NEGATIVE
Comment: NORMAL
Neisseria Gonorrhea: NEGATIVE
Trichomonas: NEGATIVE

## 2020-10-05 MED ORDER — TERCONAZOLE 0.4 % VA CREA: 1.0000 | TOPICAL_CREAM | Freq: Every day | VAGINAL | 1 refills | Status: DC

## 2020-10-08 ENCOUNTER — Encounter: Payer: Self-pay | Admitting: Unknown Physician Specialty

## 2020-10-08 ENCOUNTER — Other Ambulatory Visit: Payer: Self-pay

## 2020-10-09 DIAGNOSIS — J3501 Chronic tonsillitis: Secondary | ICD-10-CM | POA: Diagnosis not present

## 2020-10-10 ENCOUNTER — Other Ambulatory Visit
Admission: RE | Admit: 2020-10-10 | Discharge: 2020-10-10 | Disposition: A | Discharge status: Home / Self Care | Payer: Medicaid Other | Source: Ambulatory Visit | Attending: Unknown Physician Specialty | Admitting: Unknown Physician Specialty

## 2020-10-10 ENCOUNTER — Other Ambulatory Visit: Payer: Self-pay

## 2020-10-10 DIAGNOSIS — Z01812 Encounter for preprocedural laboratory examination: Secondary | ICD-10-CM | POA: Diagnosis not present

## 2020-10-10 DIAGNOSIS — Z20822 Contact with and (suspected) exposure to covid-19: Secondary | ICD-10-CM | POA: Insufficient documentation

## 2020-10-10 LAB — SARS CORONAVIRUS 2 (TAT 6-24 HRS): SARS Coronavirus 2: NEGATIVE

## 2020-10-12 ENCOUNTER — Encounter: Payer: Self-pay | Admitting: Unknown Physician Specialty

## 2020-10-12 ENCOUNTER — Encounter
Admission: RE | Disposition: A | Discharge status: Home / Self Care | Payer: Self-pay | Source: Home / Self Care | Attending: Unknown Physician Specialty

## 2020-10-12 ENCOUNTER — Ambulatory Visit: Payer: Medicaid Other | Admitting: Anesthesiology

## 2020-10-12 ENCOUNTER — Ambulatory Visit
Admission: RE | Admit: 2020-10-12 | Discharge: 2020-10-12 | Disposition: A | Discharge status: Home / Self Care | Payer: Medicaid Other | Attending: Unknown Physician Specialty | Admitting: Unknown Physician Specialty

## 2020-10-12 DIAGNOSIS — J351 Hypertrophy of tonsils: Secondary | ICD-10-CM | POA: Diagnosis not present

## 2020-10-12 DIAGNOSIS — Z87891 Personal history of nicotine dependence: Secondary | ICD-10-CM | POA: Diagnosis not present

## 2020-10-12 DIAGNOSIS — J3501 Chronic tonsillitis: Secondary | ICD-10-CM | POA: Diagnosis not present

## 2020-10-12 DIAGNOSIS — J358 Other chronic diseases of tonsils and adenoids: Secondary | ICD-10-CM | POA: Diagnosis not present

## 2020-10-12 HISTORY — PX: TONSILLECTOMY AND ADENOIDECTOMY: SHX28

## 2020-10-12 LAB — POCT PREGNANCY, URINE: Preg Test, Ur: NEGATIVE

## 2020-10-12 SURGERY — TONSILLECTOMY AND ADENOIDECTOMY
Anesthesia: General | Site: Throat

## 2020-10-12 MED ORDER — LIDOCAINE HCL (CARDIAC) PF 100 MG/5ML IV SOSY
PREFILLED_SYRINGE | INTRAVENOUS | Status: DC | PRN
  Administered 2020-10-12: 50 mg via INTRAVENOUS

## 2020-10-12 MED ORDER — SCOPOLAMINE 1 MG/3DAYS TD PT72
1.0000 | MEDICATED_PATCH | Freq: Once | TRANSDERMAL | Status: DC
  Administered 2020-10-12: 1.5 mg via TRANSDERMAL

## 2020-10-12 MED ORDER — FENTANYL CITRATE (PF) 100 MCG/2ML IJ SOLN
INTRAMUSCULAR | Status: DC | PRN
  Administered 2020-10-12: 50 ug via INTRAVENOUS

## 2020-10-12 MED ORDER — GLYCOPYRROLATE 0.2 MG/ML IJ SOLN
INTRAMUSCULAR | Status: DC | PRN
  Administered 2020-10-12: .1 mg via INTRAVENOUS

## 2020-10-12 MED ORDER — HYDROCODONE-ACETAMINOPHEN 7.5-325 MG/15ML PO SOLN: 10.0000 mL | Freq: Four times a day (QID) | ORAL | 0 refills | Status: DC | PRN

## 2020-10-12 MED ORDER — BUPIVACAINE HCL (PF) 0.5 % IJ SOLN
INTRAMUSCULAR | Status: DC | PRN
  Administered 2020-10-12: 7 mL

## 2020-10-12 MED ORDER — ONDANSETRON HCL 4 MG/2ML IJ SOLN
INTRAMUSCULAR | Status: DC | PRN
  Administered 2020-10-12: 4 mg via INTRAVENOUS

## 2020-10-12 MED ORDER — OXYCODONE HCL 5 MG/5ML PO SOLN
5.0000 mg | Freq: Once | ORAL | Status: AC | PRN
  Administered 2020-10-12: 5 mg via ORAL

## 2020-10-12 MED ORDER — MIDAZOLAM HCL 5 MG/5ML IJ SOLN
INTRAMUSCULAR | Status: DC | PRN
  Administered 2020-10-12: 2 mg via INTRAVENOUS

## 2020-10-12 MED ORDER — PROPOFOL 10 MG/ML IV BOLUS
INTRAVENOUS | Status: DC | PRN
  Administered 2020-10-12: 120 mg via INTRAVENOUS

## 2020-10-12 MED ORDER — FENTANYL CITRATE (PF) 100 MCG/2ML IJ SOLN
25.0000 ug | INTRAMUSCULAR | Status: DC | PRN
  Administered 2020-10-12 (×2): 25 ug via INTRAVENOUS

## 2020-10-12 MED ORDER — ACETAMINOPHEN 325 MG PO TABS: 325.0000 mg | ORAL_TABLET | ORAL | Status: DC | PRN

## 2020-10-12 MED ORDER — OXYCODONE HCL 5 MG PO TABS
5.0000 mg | ORAL_TABLET | Freq: Once | ORAL | Status: AC | PRN
Start: 2020-10-12 — End: 2020-10-12

## 2020-10-12 MED ORDER — ACETAMINOPHEN 160 MG/5ML PO SOLN
325.0000 mg | ORAL | Status: DC | PRN
  Administered 2020-10-12: 650 mg via ORAL

## 2020-10-12 MED ORDER — LACTATED RINGERS IV SOLN: INTRAVENOUS | Status: DC

## 2020-10-12 MED ORDER — SUCCINYLCHOLINE CHLORIDE 20 MG/ML IJ SOLN
INTRAMUSCULAR | Status: DC | PRN
  Administered 2020-10-12: 100 mg via INTRAVENOUS

## 2020-10-12 MED ORDER — ONDANSETRON HCL 4 MG/2ML IJ SOLN: 4.0000 mg | Freq: Once | INTRAMUSCULAR | Status: DC | PRN

## 2020-10-12 MED ORDER — DEXAMETHASONE SODIUM PHOSPHATE 4 MG/ML IJ SOLN
INTRAMUSCULAR | Status: DC | PRN
  Administered 2020-10-12: 10 mg via INTRAVENOUS

## 2020-10-12 SURGICAL SUPPLY — 19 items
"PENCIL ELECTRO HAND CTR " (MISCELLANEOUS) ×1 IMPLANT
CANISTER SUCT 1200ML W/VALVE (MISCELLANEOUS) ×3 IMPLANT
CATH RUBBER RED 8F (CATHETERS) ×3 IMPLANT
COAG SUCT 10F 3.5MM HAND CTRL (MISCELLANEOUS) ×3 IMPLANT
DRAPE HEAD BAR (DRAPES) ×3 IMPLANT
ELECT CAUTERY BLADE TIP 2.5 (TIP) ×3
ELECT REM PT RETURN 9FT ADLT (ELECTROSURGICAL) ×3
ELECTRODE CAUTERY BLDE TIP 2.5 (TIP) ×1 IMPLANT
ELECTRODE REM PT RTRN 9FT ADLT (ELECTROSURGICAL) ×1 IMPLANT
GLOVE BIO SURGEON STRL SZ7.5 (GLOVE) ×3 IMPLANT
KIT TURNOVER KIT A (KITS) ×3 IMPLANT
NS IRRIG 500ML POUR BTL (IV SOLUTION) ×3 IMPLANT
PACK TONSIL AND ADENOID CUSTOM (PACKS) ×3 IMPLANT
PENCIL ELECTRO HAND CTR (MISCELLANEOUS) ×3 IMPLANT
SOL ANTI-FOG 6CC FOG-OUT (MISCELLANEOUS) ×1 IMPLANT
SOL FOG-OUT ANTI-FOG 6CC (MISCELLANEOUS) ×2
SPONGE TONSIL .75 RFD DBL STRL (DISPOSABLE) ×3 IMPLANT
STRAP BODY AND KNEE 60X3 (MISCELLANEOUS) ×3 IMPLANT
SYR 10ML LL (SYRINGE) ×3 IMPLANT

## 2020-10-12 NOTE — Anesthesia Postprocedure Evaluation (Signed)
Anesthesia Post Note  Patient: Joyce Montoya  Procedure(s) Performed: TONSILLECTOMY (N/A Throat)     Patient location during evaluation: PACU Anesthesia Type: General Level of consciousness: awake and alert and oriented Pain management: satisfactory to patient Vital Signs Assessment: post-procedure vital signs reviewed and stable Respiratory status: spontaneous breathing, nonlabored ventilation and respiratory function stable Cardiovascular status: blood pressure returned to baseline and stable Postop Assessment: Adequate PO intake and No signs of nausea or vomiting Anesthetic complications: no   No complications documented.  Raliegh Ip

## 2020-10-12 NOTE — Discharge Instructions (Signed)
T & A INSTRUCTION SHEET - [facility]  Beverly Gust, MD  [address] [place], [place] [address] TEL.  [phone]  INFORMATION SHEET FOR A TONSILLECTOMY AND ADENDOIDECTOMY  About Your Tonsils and Adenoids  The tonsils and adenoids are normal body tissues that are part of our immune system.  They normally help to protect [place] against diseases that may enter our mouth and nose. However, sometimes the tonsils and/or adenoids become too large and obstruct our breathing, especially at night.    If either of these things happen it helps to remove the tonsils and adenoids in order to become healthier. The operation to remove the tonsils and adenoids is called a tonsillectomy and adenoidectomy.  The Location of Your Tonsils and Adenoids  The tonsils are located in the back of the throat on both side and sit in a cradle of muscles. The adenoids are located in the roof of the mouth, behind the nose, and closely associated with the opening of the Eustachian tube to the ear.  Surgery on Tonsils and Adenoids  A tonsillectomy and adenoidectomy is a short operation which takes about thirty minutes.  This includes being put to sleep and being awakened. Tonsillectomies and adenoidectomies are performed at [facility] and may require observation period in the recovery room prior to going home. Children are required to remain in recovery for at least 45 minutes.   Following the Operation for a Tonsillectomy  A cautery machine is used to control bleeding. Bleeding from a tonsillectomy and adenoidectomy is minimal and postoperatively the risk of bleeding is approximately four percent, although this rarely life threatening.  After your tonsillectomy and adenoidectomy post-op care at home: 1. Our patients are able to go home the same day. You may be given prescriptions for pain medications, if indicated. 2. It is extremely important to  remember that fluid intake is of utmost importance after a tonsillectomy. The amount that you drink must be maintained in the postoperative period. A good indication of whether a child is getting enough fluid is whether his/her urine output is constant. As long as children are urinating or wetting their diaper every 6 - 8 hours this is usually enough fluid intake.   3. Although rare, this is a risk of some bleeding in the first ten days after surgery. This usually occurs between day five and nine postoperatively. This risk of bleeding is approximately four percent. If you or your child should have any bleeding you should remain calm and notify our office or go directly to the emergency room at [facility] where they will contact us. Our doctors are available seven days a week for notification. We recommend sitting up quietly in a chair, place an ice pack on the front of the neck and spitting out the blood gently until we are able to contact you. Adults should gargle gently with ice water and this may help stop the bleeding. If the bleeding does not stop after a short time, i.e. 10 to 15 minutes, or seems to be increasing again, please contact us or go to the hospital.   4. It is common for the pain to be worse at 5 - 7 days postoperatively. This occurs because the "scab" is peeling off and the mucous membrane (skin of the throat) is growing back where the tonsils were.   5. It is common for a low-grade fever, less than 102, during the first week  after a tonsillectomy and adenoidectomy. It is usually due to not drinking enough liquids, and we suggest your use liquid Tylenol (acetaminophen) or the pain medicine with Tylenol (acetaminophen) prescribed in order to keep your temperature below 102. Please follow the directions on the back of the bottle. 6. Recommendations for post-operative pain in children and adults: a) For Children 12 and younger: Recommendations are for oral Tylenol  (acetaminophen) and oral Motrin (Ibuprofen). Administer the Tylenol (acetaminophen) and Motrin (ibuprofen) as stated on bottle for patient's age/weight. Sometimes it may be necessary to alternate the Tylenol (acetaminophen) and Motrin for improved pain control. Motrin does last slightly longer so many patients benefit from being given this prior to bedtime. All children should avoid Aspirin products for 2 weeks following surgery. b) For children over the age of 53: Tylenol (acetaminophen) is the preferred first choice for pain control. Depending on your child's size, sometimes they will be given a combination of Tylenol (acetaminophen) and hydrocodone medication or sometimes it will be recommended they take Motrin (ibuprofen) in addition to the Tylenol (acetaminophen). Narcotics should always be used with caution in children following surgery as they can suppress their breathing and switching to over the counter Tylenol (acetaminophen) and Motrin (ibuprofen) as soon as possible is recommended. All patients should avoid Aspirin products for 2 weeks following surgery. c) Adults: Usually adults will require a narcotic pain medication following a tonsillectomy. This usually has either hydrocodone or oxycodone in it and can usually be taken every 4 to 6 hours as needed for moderate pain. If the medication does not have Tylenol (acetaminophen) in it, you may also supplement Tylenol (acetaminophen) as needed every 4 to 6 hours for breakthrough or mild pain. Adults should avoid Aspirin, Aleve, Motrin, and Ibuprofen products for 2 weeks following surgery as they can increase your risk of bleeding. 7. If you happen to look in the mirror or into your child's mouth you will see white/gray patches on the back of the throat. This is what a scab looks like in the mouth and is normal after having a tonsillectomy and adenoidectomy. They will disappear once the tonsil areas heal completely. However, it may cause a noticeable odor,  and this too will disappear with time.     8. You or your child may experience ear pain after having a tonsillectomy and adenoidectomy.  This is called referred pain and comes from the throat, but it is felt in the ears.  Ear pain is quite common and expected. It will usually go away after ten days. There is usually nothing wrong with the ears, and it is primarily due to the healing area stimulating the nerve to the ear that runs along the side of the throat. Use either the prescribed pain medicine or Tylenol (acetaminophen) as needed.  9. The throat tissues after a tonsillectomy are obviously sensitive. Smoking around children who have had a tonsillectomy significantly increases the risk of bleeding. DO NOT SMOKE!  General Anesthesia, Adult, Care After This sheet gives you information about how to care for yourself after your procedure. Your health care provider may also give you more specific instructions. If you have problems or questions, contact your health care provider. What can I expect after the procedure? After the procedure, the following side effects are common:  Pain or discomfort at the IV site.  Nausea.  Vomiting.  Sore throat.  Trouble concentrating.  Feeling cold or chills.  Weak or tired.  Sleepiness and fatigue.  Soreness and body aches.  These side effects can affect parts of the body that were not involved in surgery. Follow these instructions at home:  For at least 24 hours after the procedure:  Have a responsible adult stay with you. It is important to have someone help care for you until you are awake and alert.  Rest as needed.  Do not: ? Participate in activities in which you could fall or become injured. ? Drive. ? Use heavy machinery. ? Drink alcohol. ? Take sleeping pills or medicines that cause drowsiness. ? Make important decisions or sign legal documents. ? Take care of children on your own. Eating and drinking  Follow any instructions from  your health care provider about eating or drinking restrictions.  When you feel hungry, start by eating small amounts of foods that are soft and easy to digest (bland), such as toast. Gradually return to your regular diet.  Drink enough fluid to keep your urine pale yellow.  If you vomit, rehydrate by drinking water, juice, or clear broth. General instructions  If you have sleep apnea, surgery and certain medicines can increase your risk for breathing problems. Follow instructions from your health care provider about wearing your sleep device: ? Anytime you are sleeping, including during daytime naps. ? While taking prescription pain medicines, sleeping medicines, or medicines that make you drowsy.  Return to your normal activities as told by your health care provider. Ask your health care provider what activities are safe for you.  Take over-the-counter and prescription medicines only as told by your health care provider.  If you smoke, do not smoke without supervision.  Keep all follow-up visits as told by your health care provider. This is important. Contact a health care provider if:  You have nausea or vomiting that does not get better with medicine.  You cannot eat or drink without vomiting.  You have pain that does not get better with medicine.  You are unable to pass urine.  You develop a skin rash.  You have a fever.  You have redness around your IV site that gets worse. Get help right away if:  You have difficulty breathing.  You have chest pain.  You have blood in your urine or stool, or you vomit blood. Summary  After the procedure, it is common to have a sore throat or nausea. It is also common to feel tired.  Have a responsible adult stay with you for the first 24 hours after general anesthesia. It is important to have someone help care for you until you are awake and alert.  When you feel hungry, start by eating small amounts of foods that are soft and  easy to digest (bland), such as toast. Gradually return to your regular diet.  Drink enough fluid to keep your urine pale yellow.  Return to your normal activities as told by your health care provider. Ask your health care provider what activities are safe for you. This information is not intended to replace advice given to you by your health care provider. Make sure you discuss any questions you have with your health care provider. Document Revised: 10/23/2017 Document Reviewed: 06/05/2017 Elsevier Patient Education  [place].  Scopolamine skin patches Remove in 72 hrs. Wash hands immediately after removal. What is this medicine? SCOPOLAMINE (skoe POL a meen) is used to prevent nausea and vomiting caused by motion sickness, anesthesia and surgery. This medicine may be used for other purposes; ask your health care provider or pharmacist if you have  questions. COMMON BRAND NAME(S): Transderm Scop What should I tell my health care provider before I take this medicine? They need to know if you have any of these conditions:  are scheduled to have a gastric secretion test  glaucoma  heart disease  kidney disease  liver disease  lung or breathing disease, like asthma  mental illness  prostate disease  seizures  stomach or intestine problems  trouble passing urine  an unusual or allergic reaction to scopolamine, atropine, other medicines, foods, dyes, or preservatives  pregnant or trying to get pregnant  breast-feeding How should I use this medicine? This medicine is for external use only. Follow the directions on the prescription label. Wear only 1 patch at a time. Choose an area behind the ear, that is clean, dry, hairless and free from any cuts or irritation. Wipe the area with a clean dry tissue. Peel off the plastic backing of the skin patch, trying not to touch the adhesive side with your hands. Do not cut the patches. Firmly apply to the area you have chosen,  with the metallic side of the patch to the skin and the tan-colored side showing. Once firmly in place, wash your hands well with soap and water. Do not get this medicine into your eyes. After removing the patch, wash your hands and the area behind your ear thoroughly with soap and water. The patch will still contain some medicine after use. To avoid accidental contact or ingestion by children or pets, fold the used patch in half with the sticky side together and throw away in the trash out of the reach of children and pets. If you need to use a second patch after you remove the first, place it behind the other ear. A special MedGuide will be given to you by the pharmacist with each prescription and refill. Be sure to read this information carefully each time. Talk to your pediatrician regarding the use of this medicine in children. Special care may be needed. Overdosage: If you think you have taken too much of this medicine contact a poison control center or emergency room at once. NOTE: This medicine is only for you. Do not share this medicine with others. What if I miss a dose? This does not apply. This medicine is not for regular use. What may interact with this medicine?  alcohol  antihistamines for allergy cough and cold  atropine  certain medicines for anxiety or sleep  certain medicines for bladder problems like oxybutynin, tolterodine  certain medicines for depression like amitriptyline, fluoxetine, sertraline  certain medicines for stomach problems like dicyclomine, hyoscyamine  certain medicines for Parkinson's disease like benztropine, trihexyphenidyl  certain medicines for seizures like phenobarbital, primidone  general anesthetics like halothane, isoflurane, methoxyflurane, propofol  ipratropium  local anesthetics like lidocaine, pramoxine, tetracaine  medicines that relax muscles for surgery  phenothiazines like chlorpromazine, mesoridazine, prochlorperazine,  thioridazine  narcotic medicines for pain  other belladonna alkaloids This list may not describe all possible interactions. Give your health care provider a list of all the medicines, herbs, non-prescription drugs, or dietary supplements you use. Also tell them if you smoke, drink alcohol, or use illegal drugs. Some items may interact with your medicine. What should I watch for while using this medicine? Limit contact with water while swimming and bathing because the patch may fall off. If the patch falls off, throw it away and put a new one behind the other ear. You may get drowsy or dizzy. Do not drive, use  machinery, or do anything that needs mental alertness until you know how this medicine affects you. Do not stand or sit up quickly, especially if you are an older patient. This reduces the risk of dizzy or fainting spells. Alcohol may interfere with the effect of this medicine. Avoid alcoholic drinks. Your mouth may get dry. Chewing sugarless gum or sucking hard candy, and drinking plenty of water may help. Contact your healthcare professional if the problem does not go away or is severe. This medicine may cause dry eyes and blurred vision. If you wear contact lenses, you may feel some discomfort. Lubricating drops may help. See your healthcare professional if the problem does not go away or is severe. If you are going to need surgery, an MRI, CT scan, or other procedure, tell your healthcare professional that you are using this medicine. You may need to remove the patch before the procedure. What side effects may I notice from receiving this medicine? Side effects that you should report to your doctor or health care professional as soon as possible:  allergic reactions like skin rash, itching or hives; swelling of the face, lips, or tongue  blurred vision  changes in vision  confusion  dizziness  eye pain  fast, irregular heartbeat  hallucinations, loss of contact with  reality  nausea, vomiting  pain or trouble passing urine  restlessness  seizures  skin irritation  stomach pain Side effects that usually do not require medical attention (report to your doctor or health care professional if they continue or are bothersome):  drowsiness  dry mouth  headache  sore throat This list may not describe all possible side effects. Call your doctor for medical advice about side effects. You may report side effects to FDA at 1-800-FDA-1088. Where should I keep my medicine? Keep out of the reach of children. Store at room temperature between 20 and 25 degrees C (68 and 77 degrees F). Keep this medicine in the foil package until ready to use. Throw away any unused medicine after the expiration date. NOTE: This sheet is a summary. It may not cover all possible information. If you have questions about this medicine, talk to your doctor, pharmacist, or health care provider.  2020 Elsevier/Gold Standard (2018-01-08 16:14:46)

## 2020-10-12 NOTE — Anesthesia Preprocedure Evaluation (Signed)
Anesthesia Evaluation  Patient identified by MRN, date of birth, ID band Patient awake    Reviewed: Allergy & Precautions, H&P , NPO status , Patient's Chart, lab work & pertinent test results  Airway Mallampati: II  TM Distance: >3 FB Neck ROM: full    Dental no notable dental hx.    Pulmonary former smoker,    Pulmonary exam normal breath sounds clear to auscultation       Cardiovascular Normal cardiovascular exam Rhythm:regular Rate:Normal     Neuro/Psych  Headaches,    GI/Hepatic   Endo/Other    Renal/GU      Musculoskeletal   Abdominal   Peds  Hematology   Anesthesia Other Findings   Reproductive/Obstetrics                             Anesthesia Physical Anesthesia Plan  ASA: II  Anesthesia Plan: General ETT   Post-op Pain Management:    Induction:   PONV Risk Score and Plan: 3 and Treatment may vary due to age or medical condition, Ondansetron, Dexamethasone and Scopolamine patch - Pre-op  Airway Management Planned:   Additional Equipment:   Intra-op Plan:   Post-operative Plan:   Informed Consent: I have reviewed the patients History and Physical, chart, labs and discussed the procedure including the risks, benefits and alternatives for the proposed anesthesia with the patient or authorized representative who has indicated his/her understanding and acceptance.     Dental Advisory Given  Plan Discussed with: CRNA  Anesthesia Plan Comments:         Anesthesia Quick Evaluation

## 2020-10-12 NOTE — Anesthesia Procedure Notes (Signed)
Procedure Name: Intubation Date/Time: 10/12/2020 10:18 AM Performed by: Mayme Genta, CRNA Pre-anesthesia Checklist: Patient identified, Emergency Drugs available, Suction available, Patient being monitored and Timeout performed Patient Re-evaluated:Patient Re-evaluated prior to induction Oxygen Delivery Method: Circle system utilized Preoxygenation: Pre-oxygenation with 100% oxygen Induction Type: IV induction Ventilation: Mask ventilation without difficulty Laryngoscope Size: Miller and 2 Grade View: Grade I Tube type: Oral Rae Tube size: 7.0 mm Number of attempts: 1 Placement Confirmation: ETT inserted through vocal cords under direct vision,  positive ETCO2 and breath sounds checked- equal and bilateral Tube secured with: Tape Dental Injury: Teeth and Oropharynx as per pre-operative assessment

## 2020-10-12 NOTE — Op Note (Signed)
PREOPERATIVE DIAGNOSIS:  chronic tonsillitis  POSTOPERATIVE DIAGNOSIS:  Chronic Tonsillitis  OPERATION:  Tonsillectomy.  SURGEON:  Roena Malady, MD  ANESTHESIA:  General endotracheal.  OPERATIVE FINDINGS:  Large tonsils.  No significant adenoid tissue  DESCRIPTION OF THE PROCEDURE: Joyce Montoya was identified in the holding area and taken to the operating room and placed in the supine position.  After general endotracheal anesthesia, the table was turned 45 degrees and the patient was draped in the usual fashion for a tonsillectomy.  A mouth gag was inserted into the oral cavity.  There were large tonsils.   Examination the nasopharynx showed no significant adenoid tissue.  Beginning on the left-hand side a tenaculum was used to grasp the tonsil and the Bovie cautery was used to dissect it free from the fossa.  In a similar fashion, the right tonsil was removed.  Meticulous hemostasis was achieved using the Bovie cautery.  With both tonsils removed and no active bleeding, 0.5% plain Marcaine was used to inject the anterior and posterior tonsillar pillars bilaterally.  A total of 7 mL was used.  The patient tolerated the procedure well and was awakened in the operating room and taken to the recovery room in stable condition.   CULTURES:  None.  SPECIMENS:  Tonsils.  ESTIMATED BLOOD LOSS:  Less than 10 ml.  Roena Malady  10/12/2020  10:30 AM

## 2020-10-12 NOTE — Transfer of Care (Signed)
Immediate Anesthesia Transfer of Care Note  Patient: Joyce Montoya  Procedure(s) Performed: TONSILLECTOMY (N/A Throat)  Patient Location: PACU  Anesthesia Type: General ETT  Level of Consciousness: awake, alert  and patient cooperative  Airway and Oxygen Therapy: Patient Spontanous Breathing and Patient connected to supplemental oxygen  Post-op Assessment: Post-op Vital signs reviewed, Patient's Cardiovascular Status Stable, Respiratory Function Stable, Patent Airway and No signs of Nausea or vomiting  Post-op Vital Signs: Reviewed and stable  Complications: No complications documented.

## 2020-10-12 NOTE — H&P (Signed)
The patient's history has been reviewed, patient examined, no change in status, stable for surgery.  Questions were answered to the patients satisfaction.  

## 2020-10-15 LAB — SURGICAL PATHOLOGY

## 2020-10-23 ENCOUNTER — Other Ambulatory Visit: Payer: Self-pay | Admitting: Obstetrics & Gynecology

## 2020-10-23 MED ORDER — NORETHIN ACE-ETH ESTRAD-FE 1-20 MG-MCG PO TABS: 1.0000 | ORAL_TABLET | Freq: Every day | ORAL | 3 refills | Status: DC

## 2020-11-03 DIAGNOSIS — Z419 Encounter for procedure for purposes other than remedying health state, unspecified: Secondary | ICD-10-CM | POA: Diagnosis not present

## 2020-12-04 DIAGNOSIS — Z419 Encounter for procedure for purposes other than remedying health state, unspecified: Secondary | ICD-10-CM | POA: Diagnosis not present

## 2020-12-06 ENCOUNTER — Encounter: Payer: Self-pay | Admitting: Primary Care

## 2020-12-06 ENCOUNTER — Ambulatory Visit (INDEPENDENT_AMBULATORY_CARE_PROVIDER_SITE_OTHER): Payer: Medicaid Other | Admitting: Primary Care

## 2020-12-06 ENCOUNTER — Other Ambulatory Visit: Payer: Self-pay

## 2020-12-06 VITALS — BP 112/60 | HR 97 | Temp 97.8°F | Ht 61.0 in | Wt 120.8 lb

## 2020-12-06 DIAGNOSIS — R519 Headache, unspecified: Secondary | ICD-10-CM

## 2020-12-06 LAB — CBC
HCT: 36.1 % (ref 36.0–46.0)
Hemoglobin: 12.5 g/dL (ref 12.0–15.0)
MCHC: 34.6 g/dL (ref 30.0–36.0)
MCV: 90.9 fl (ref 78.0–100.0)
Platelets: 217 10*3/uL (ref 150.0–400.0)
RBC: 3.97 Mil/uL (ref 3.87–5.11)
RDW: 12.1 % (ref 11.5–15.5)
WBC: 3.4 10*3/uL — ABNORMAL LOW (ref 4.0–10.5)

## 2020-12-06 LAB — COMPREHENSIVE METABOLIC PANEL
ALT: 8 U/L (ref 0–35)
AST: 14 U/L (ref 0–37)
Albumin: 4.4 g/dL (ref 3.5–5.2)
Alkaline Phosphatase: 42 U/L (ref 39–117)
BUN: 13 mg/dL (ref 6–23)
CO2: 28 mEq/L (ref 19–32)
Calcium: 9.3 mg/dL (ref 8.4–10.5)
Chloride: 104 mEq/L (ref 96–112)
Creatinine, Ser: 0.67 mg/dL (ref 0.40–1.20)
GFR: 120.92 mL/min (ref >60.00)
Glucose, Bld: 78 mg/dL (ref 70–99)
Potassium: 4.2 mEq/L (ref 3.5–5.1)
Sodium: 137 mEq/L (ref 135–145)
Total Bilirubin: 0.4 mg/dL (ref 0.2–1.2)
Total Protein: 6.9 g/dL (ref 6.0–8.3)

## 2020-12-06 LAB — TSH: TSH: 0.9 u[IU]/mL (ref 0.35–4.50)

## 2020-12-06 MED ORDER — SUMATRIPTAN SUCCINATE 50 MG PO TABS: ORAL_TABLET | ORAL | 0 refills | Status: DC

## 2020-12-06 MED ORDER — PROPRANOLOL HCL ER 80 MG PO CP24: 80.0000 mg | ORAL_CAPSULE | Freq: Every day | ORAL | 1 refills | Status: DC

## 2020-12-06 MED ORDER — KETOROLAC TROMETHAMINE 60 MG/2ML IM SOLN
60.0000 mg | Freq: Once | INTRAMUSCULAR | Status: AC
Start: 2020-12-06 — End: 2020-12-06
  Administered 2020-12-06: 60 mg via INTRAMUSCULAR

## 2020-12-06 NOTE — Assessment & Plan Note (Addendum)
Chronic and progressing in frequency and intensity over last 2-3 months.  Neuro exam today negative. No FH of migraines or brain tumors.  Given sudden increase in frequency and intensity, will obtain MRI to rule out other causes. Will also check labs.   Rx for Propranolol ER 80 mg sent to pharmacy for daily prevention. She has no history of asthma.   Rx for sumatriptan 50 mg sent to pharmacy for abortive treatment. Discussed directions.  IM Toradol 60 mg provided today.  We will plan to see her back in 1 month for follow up.

## 2020-12-06 NOTE — Progress Notes (Signed)
Subjective:    Patient ID: Joyce Montoya, female    DOB: 1996/06/01, 25 y.o.   MRN: [id number]  HPI  This visit occurred during the SARS-CoV-2 public health emergency.  Safety protocols were in place, including screening questions prior to the visit, additional usage of staff PPE, and extensive cleaning of exam room while observing appropriate contact time as indicated for disinfecting solutions.   Joyce Montoya is a 25 year old female with a history of frequent headaches, depression, chronic diarrhea who presents today to discuss headaches.   History of chronic headaches for which typically occurred about once weekly lasting a few hours. She would take Ibuprofen or Tylenol with relief. No prior diagnosis of migraines.   Over the last 2-3 months she's noticed an increased frequency of headaches which were occurring 2-3 times weekly, sometimes improvement with Ibuprofen and Tylenol.  Over the last several weeks she's noticed headaches nearly daily that do not resolve with OTC medications. She's noticed increased intensity of her headaches and will experience photophobia, phonophobia, nausea during some of her headaches.   She was drinking alcohol socially last Fall, stopped drinking in November 2021 to see if this was contributing to headaches, she did not notice any difference. Headaches occur to the frontal lobes, temporal lobes, and parietal lobes. Today she has a headache to the left parietal lobe.  She denies a family history of brain tumors and migraines. She denies chronic nasal congestion, sinus pressure. She quit smoking one year ago, uses a Vape infrequently.    BP Readings from Last 3 Encounters:  12/06/20 112/60  10/12/20 107/83  10/03/20 109/72     Review of Systems  Eyes: Positive for photophobia. Negative for visual disturbance.  Gastrointestinal: Positive for nausea.  Neurological: Positive for headaches.       Occasional dizziness       Past Medical History:   Diagnosis Date  . Frequent headaches   . History of chlamydia   . Irregular menses   . SAB (spontaneous abortion) 11/2016     Social History   Socioeconomic History  . Marital status: Single    Spouse name: Not on file  . Number of children: Not on file  . Years of education: Not on file  . Highest education level: Not on file  Occupational History  . Not on file  Tobacco Use  . Smoking status: Former Smoker    Packs/day: 0.25    Types: Cigarettes  . Smokeless tobacco: Never Used  Vaping Use  . Vaping Use: Never used  Substance and Sexual Activity  . Alcohol use: Not Currently    Comment: social  . Drug use: No  . Sexual activity: Yes    Partners: Male    Birth control/protection: Patch    Comment:    Other Topics Concern  . Not on file  Social History Narrative   Single.   Works for [employer].   Enjoys riding her horses.    Social Determinants of Health   Financial Resource Strain: Not on file  Food Insecurity: Not on file  Transportation Needs: Not on file  Physical Activity: Not on file  Stress: Not on file  Social Connections: Not on file  Intimate Partner Violence: Not on file    Past Surgical History:  Procedure Laterality Date  . HAND SURGERY Right 06/2016  . TONSILLECTOMY AND ADENOIDECTOMY Bilateral 08/03/2020   Procedure: TONSILLECTOMY AND possible ADENOIDECTOMY;  Surgeon: Beverly Gust, MD;  Location: [facility];  Service: ENT;  Laterality: Bilateral;  . TONSILLECTOMY AND ADENOIDECTOMY N/A 10/12/2020   Procedure: TONSILLECTOMY;  Surgeon: Beverly Gust, MD;  Location: [facility];  Service: ENT;  Laterality: N/A;  . WISDOM TOOTH EXTRACTION      Family History  Problem Relation Age of Onset  . Headache Mother   . Ovarian cysts Mother   . Headache Sister     No Known Allergies  Current Outpatient Medications on File Prior to Visit  Medication Sig Dispense Refill  . norethindrone-ethinyl estradiol (JUNEL  FE 1/20) 1-20 MG-MCG tablet Take 1 tablet by mouth daily. 84 tablet 3  . HYDROcodone-acetaminophen (HYCET) 7.5-325 mg/15 ml solution Take 10 mLs by mouth 4 (four) times daily as needed for moderate pain. (Patient not taking: Reported on 12/06/2020) 450 mL 0  . terconazole (TERAZOL 7) 0.4 % vaginal cream Place 1 applicator vaginally at bedtime. (Patient not taking: Reported on 12/06/2020) 45 g 1   No current facility-administered medications on file prior to visit.    BP 112/60   Pulse 97   Temp 97.8 F (36.6 C) (Oral)   Ht 5\' 1"  (1.549 m)   Wt 120 lb 12.8 oz (54.8 kg)   SpO2 99%   BMI 22.82 kg/m    Objective:   Physical Exam Constitutional:      Appearance: She is well-nourished.  Eyes:     Extraocular Movements: Extraocular movements intact.  Cardiovascular:     Rate and Rhythm: Normal rate and regular rhythm.  Pulmonary:     Effort: Pulmonary effort is normal.     Breath sounds: Normal breath sounds.  Musculoskeletal:     Cervical back: Neck supple.  Skin:    General: Skin is warm and dry.  Neurological:     Mental Status: She is alert and oriented to person, place, and time.     Cranial Nerves: No cranial nerve deficit.     Coordination: Coordination normal.  Psychiatric:        Mood and Affect: Mood and affect normal.            Assessment & Plan:

## 2020-12-06 NOTE — Patient Instructions (Signed)
You will be contacted regarding your MRI of the head.  Please let us know if you have not been contacted within two weeks.   Start propranolol ER 80 mg daily for headache prevention.   You may take sumatriptan (Imitrex) 50 mg at migraine onset, may repeat in 2 hours if migraines persist. Do not exceed 2 tablets in 24 hours.   Please schedule a follow up appointment in 1 month.  It was a pleasure to see you today!

## 2020-12-07 ENCOUNTER — Telehealth: Payer: Self-pay | Admitting: Primary Care

## 2020-12-07 NOTE — Telephone Encounter (Signed)
Left message to return call 

## 2020-12-07 NOTE — Telephone Encounter (Signed)
Please have the patient take the medication I prescribed yesterday.   She can take the sumatriptan to try to abort the migraine, we discussed this in great detail yesterday. If she tries the sumatriptan and no relief after 2 hours, she can take another tablet as discussed yesterday.  If no relief after 2 tablets then she needs to go to the emergency department. Also have her start the propranolol once daily for prevention.   I have not had a chance to review her lab results, once I do I will put my comments on her MyChart account.

## 2020-12-07 NOTE — Telephone Encounter (Signed)
Called pt about her MRI Brain scheduled 12/16/20 - soonest appt. [facility] is no longer doing Mobile MRI.   Toradol Injection given 12/06/20 OV only about 20-9mins and then severe headache set in.  Severe Headache last night Tried pressure points, ice pack Did not take any OTC meds or Rx migraine meds.  Some Nausea, denies vomiting Pt reports having Dizziness, blurred vision, photophobia - states that she had to sit with a blanket over her head last night to keep any light out.  Pt notes having "bad headaches" every single day - seems to be worsening.  Pt requesting lab results - reviewed these on Mychart and was concerned with the numbers.   Please advise, thanks.

## 2020-12-10 NOTE — Telephone Encounter (Signed)
Called patient she received information last week in message from [place]. Did not have any further questions.

## 2020-12-14 NOTE — Telephone Encounter (Signed)
Pt called regarding referral. She states she was advised by [facility] to call our office to verify that everything was put in correctly with the referral. She is requesting a call from referral coordinator to discuss. Routing to Anastasiya to reach out to patient.

## 2020-12-14 NOTE — Telephone Encounter (Signed)
I was able to get authorization process pushed forward faster and MRI brain is approved. Patient advised. Referral and appointment notes for MRI has been updated. Auth # [id number], EXP date 02/10/2021

## 2020-12-14 NOTE — Telephone Encounter (Signed)
Noted  

## 2020-12-14 NOTE — Telephone Encounter (Signed)
Last night she had a really bad migraine. Yesterday they called her to say they were canceling her MRI due to her insurance denying it. She was going to pay cash for it but they will  Not let her do that because she has insurance. She is not wanting to wait to have it done. She is working on what to do so that she can get it done ASAP.  Asking if there is something else she can take other than sumatriptan. Took the sumatriptan and her scalp was burning, seeing black spots, breathing heavy. Send to [facility].

## 2020-12-14 NOTE — Telephone Encounter (Signed)
Spoke with patient. Insurance is still pending decision, not denied at this time, waiting for response. Advised patient I will keep contacting insurance and will call her back before I leave today.

## 2020-12-16 ENCOUNTER — Other Ambulatory Visit: Payer: Self-pay

## 2020-12-16 ENCOUNTER — Ambulatory Visit: Payer: Medicaid Other

## 2020-12-16 ENCOUNTER — Ambulatory Visit
Admission: RE | Admit: 2020-12-16 | Discharge: 2020-12-16 | Disposition: A | Discharge status: Home / Self Care | Payer: Medicaid Other | Source: Ambulatory Visit | Attending: Primary Care | Admitting: Primary Care

## 2020-12-16 DIAGNOSIS — R519 Headache, unspecified: Secondary | ICD-10-CM | POA: Insufficient documentation

## 2020-12-16 DIAGNOSIS — J3489 Other specified disorders of nose and nasal sinuses: Secondary | ICD-10-CM | POA: Diagnosis not present

## 2020-12-16 DIAGNOSIS — J322 Chronic ethmoidal sinusitis: Secondary | ICD-10-CM | POA: Diagnosis not present

## 2020-12-17 ENCOUNTER — Telehealth: Payer: Self-pay

## 2020-12-17 DIAGNOSIS — R519 Headache, unspecified: Secondary | ICD-10-CM

## 2020-12-17 MED ORDER — TOPIRAMATE 50 MG PO TABS: 50.0000 mg | ORAL_TABLET | Freq: Every day | ORAL | 0 refills | Status: DC

## 2020-12-17 NOTE — Telephone Encounter (Signed)
Pt left v/m requesting cb to discuss MRI of brain when results are ready and Gentry Fitz NP has reviewed the results.

## 2020-12-17 NOTE — Telephone Encounter (Signed)
Patient is calling back about her results. EM

## 2020-12-17 NOTE — Telephone Encounter (Signed)
Spoke with patient via phone regarding MRI results and questions.  She has found no relief with propanolol or sumatriptan. Given no improvement with medication treatment coupled with MRI results we will send to neurology for evaluation, referral placed.  In the meantime she does agree to try a different medication for headache treatment.  Prescription for topiramate 50 mg sent to pharmacy.  She will update.

## 2020-12-17 NOTE — Telephone Encounter (Signed)
Noted, will review results later today.

## 2020-12-20 ENCOUNTER — Telehealth: Payer: Self-pay

## 2020-12-20 NOTE — Telephone Encounter (Signed)
Vm from pt requesting images of MRI either printed or sent to her MyChart.   Left message on vm per dpr informing pt she will need to contact the imaging facility to get copies of images or to see if they would be able to send via [place].

## 2020-12-28 ENCOUNTER — Other Ambulatory Visit: Payer: Self-pay | Admitting: Primary Care

## 2020-12-28 DIAGNOSIS — R519 Headache, unspecified: Secondary | ICD-10-CM

## 2021-01-01 DIAGNOSIS — Z419 Encounter for procedure for purposes other than remedying health state, unspecified: Secondary | ICD-10-CM | POA: Diagnosis not present

## 2021-01-03 ENCOUNTER — Ambulatory Visit: Payer: Medicaid Other | Admitting: Primary Care

## 2021-01-21 ENCOUNTER — Other Ambulatory Visit: Payer: Self-pay | Admitting: Primary Care

## 2021-01-21 DIAGNOSIS — G4489 Other headache syndrome: Secondary | ICD-10-CM | POA: Diagnosis not present

## 2021-01-21 DIAGNOSIS — R519 Headache, unspecified: Secondary | ICD-10-CM

## 2021-01-21 DIAGNOSIS — H53149 Visual discomfort, unspecified: Secondary | ICD-10-CM | POA: Diagnosis not present

## 2021-01-21 DIAGNOSIS — R110 Nausea: Secondary | ICD-10-CM | POA: Diagnosis not present

## 2021-01-21 DIAGNOSIS — F40298 Other specified phobia: Secondary | ICD-10-CM | POA: Diagnosis not present

## 2021-01-29 DIAGNOSIS — R519 Headache, unspecified: Secondary | ICD-10-CM | POA: Diagnosis not present

## 2021-02-01 DIAGNOSIS — Z419 Encounter for procedure for purposes other than remedying health state, unspecified: Secondary | ICD-10-CM | POA: Diagnosis not present

## 2021-03-03 DIAGNOSIS — Z419 Encounter for procedure for purposes other than remedying health state, unspecified: Secondary | ICD-10-CM | POA: Diagnosis not present

## 2021-03-04 DIAGNOSIS — H53149 Visual discomfort, unspecified: Secondary | ICD-10-CM | POA: Diagnosis not present

## 2021-03-04 DIAGNOSIS — G4489 Other headache syndrome: Secondary | ICD-10-CM | POA: Diagnosis not present

## 2021-03-04 DIAGNOSIS — F40298 Other specified phobia: Secondary | ICD-10-CM | POA: Diagnosis not present

## 2021-03-04 DIAGNOSIS — G4440 Drug-induced headache, not elsewhere classified, not intractable: Secondary | ICD-10-CM | POA: Diagnosis not present

## 2021-04-03 DIAGNOSIS — Z419 Encounter for procedure for purposes other than remedying health state, unspecified: Secondary | ICD-10-CM | POA: Diagnosis not present

## 2021-05-03 DIAGNOSIS — Z419 Encounter for procedure for purposes other than remedying health state, unspecified: Secondary | ICD-10-CM | POA: Diagnosis not present

## 2021-06-03 DIAGNOSIS — Z419 Encounter for procedure for purposes other than remedying health state, unspecified: Secondary | ICD-10-CM | POA: Diagnosis not present

## 2021-07-04 DIAGNOSIS — Z419 Encounter for procedure for purposes other than remedying health state, unspecified: Secondary | ICD-10-CM | POA: Diagnosis not present

## 2021-08-03 DIAGNOSIS — Z419 Encounter for procedure for purposes other than remedying health state, unspecified: Secondary | ICD-10-CM | POA: Diagnosis not present

## 2021-08-13 ENCOUNTER — Other Ambulatory Visit: Payer: Self-pay | Admitting: Obstetrics & Gynecology

## 2021-09-03 DIAGNOSIS — Z419 Encounter for procedure for purposes other than remedying health state, unspecified: Secondary | ICD-10-CM | POA: Diagnosis not present

## 2021-10-03 DIAGNOSIS — Z419 Encounter for procedure for purposes other than remedying health state, unspecified: Secondary | ICD-10-CM | POA: Diagnosis not present

## 2021-11-03 DIAGNOSIS — Z419 Encounter for procedure for purposes other than remedying health state, unspecified: Secondary | ICD-10-CM | POA: Diagnosis not present

## 2021-11-08 ENCOUNTER — Ambulatory Visit (INDEPENDENT_AMBULATORY_CARE_PROVIDER_SITE_OTHER): Payer: Medicaid Other | Admitting: Obstetrics and Gynecology

## 2021-11-08 ENCOUNTER — Encounter: Payer: Self-pay | Admitting: Obstetrics and Gynecology

## 2021-11-08 ENCOUNTER — Other Ambulatory Visit: Payer: Self-pay

## 2021-11-08 VITALS — BP 112/70 | Ht 61.0 in | Wt 120.4 lb

## 2021-11-08 DIAGNOSIS — R875 Abnormal microbiological findings in specimens from female genital organs: Secondary | ICD-10-CM | POA: Diagnosis not present

## 2021-11-08 DIAGNOSIS — N771 Vaginitis, vulvitis and vulvovaginitis in diseases classified elsewhere: Secondary | ICD-10-CM

## 2021-11-08 DIAGNOSIS — R300 Dysuria: Secondary | ICD-10-CM

## 2021-11-08 LAB — POCT URINALYSIS DIPSTICK
Bilirubin, UA: POSITIVE
Blood, UA: NEGATIVE
Glucose, UA: NEGATIVE
Ketones, UA: NEGATIVE
Nitrite, UA: NEGATIVE
Protein, UA: NEGATIVE
Spec Grav, UA: >=1.03 — AB (ref 1.010–1.025)
Urobilinogen, UA: 0.2 E.U./dL
pH, UA: 5 (ref 5.0–8.0)

## 2021-11-08 MED ORDER — METRONIDAZOLE 500 MG PO TABS: 500.0000 mg | ORAL_TABLET | Freq: Two times a day (BID) | ORAL | 0 refills | Status: DC

## 2021-11-08 NOTE — Progress Notes (Signed)
Patient ID: Joyce Montoya, female   DOB: 1995/11/06, 26 y.o.   MRN: [id number]  Reason for Consult: Vaginal Discharge   Referred by [name], NP  Subjective:     HPI:  Joyce Montoya is a 26 y.o. female-she reports that she has been having foul odor and abnormal discharge.  This has been present for over a week.  She has had some pain with intercourse.  She is also experiencing painful urination.  She reports that her partner also has an enlarged scrotum.  Gynecological History  Patient's last menstrual period was 11/01/2021.  Past Medical History:  Diagnosis Date   Frequent headaches    History of chlamydia    Irregular menses    SAB (spontaneous abortion) 11/2016   Family History  Problem Relation Age of Onset   Headache Mother    Ovarian cysts Mother    Headache Sister    Past Surgical History:  Procedure Laterality Date   HAND SURGERY Right 06/2016   TONSILLECTOMY AND ADENOIDECTOMY Bilateral 08/03/2020   Procedure: TONSILLECTOMY AND possible ADENOIDECTOMY;  Surgeon: Beverly Gust, MD;  Location: [facility];  Service: ENT;  Laterality: Bilateral;   TONSILLECTOMY AND ADENOIDECTOMY N/A 10/12/2020   Procedure: TONSILLECTOMY;  Surgeon: Beverly Gust, MD;  Location: [facility];  Service: ENT;  Laterality: N/A;   WISDOM TOOTH EXTRACTION      Short Social History:  Social History   Tobacco Use   Smoking status: Former    Packs/day: 0.25    Types: Cigarettes   Smokeless tobacco: Never  Substance Use Topics   Alcohol use: Not Currently    Comment: social    No Known Allergies  Current Outpatient Medications  Medication Sig Dispense Refill   [name] 1/20 1-20 MG-MCG tablet TAKE 1 TABLET BY MOUTH EVERY DAY 84 tablet 3   metroNIDAZOLE (FLAGYL) 500 MG tablet Take 1 tablet (500 mg total) by mouth 2 (two) times daily. 14 tablet 0   SUMAtriptan (IMITREX) 50 MG tablet Take 1 tablet by mouth at migraine onset. May repeat in 2 hours if  headache persists or recurs. (Patient not taking: Reported on 11/08/2021) 10 tablet 0   topiramate (TOPAMAX) 50 MG tablet Take 1 tablet (50 mg total) by mouth at bedtime. For headache prevention. (Patient not taking: Reported on 11/08/2021) 30 tablet 0   No current facility-administered medications for this visit.    Review of Systems  Constitutional: Negative for chills, fatigue, fever and unexpected weight change.  HENT: Negative for trouble swallowing.  Eyes: Negative for loss of vision.  Respiratory: Negative for cough, shortness of breath and wheezing.  Cardiovascular: Negative for chest pain, leg swelling, palpitations and syncope.  GI: Negative for abdominal pain, blood in stool, diarrhea, nausea and vomiting.  GU: Positive for dysuria. Negative for difficulty urinating, frequency and hematuria.  Musculoskeletal: Negative for back pain, leg pain and joint pain.  Skin: Negative for rash.  Neurological: Negative for dizziness, headaches, light-headedness, numbness and seizures.  Psychiatric: Negative for behavioral problem, confusion, depressed mood and sleep disturbance.       Objective:  Objective   Vitals:   11/08/21 1604  BP: 112/70  Weight: 120 lb 6.4 oz (54.6 kg)  Height: 5\' 1"  (1.549 m)   Body mass index is 22.75 kg/m.  Physical Exam Vitals and nursing note reviewed. Exam conducted with a chaperone present.  Constitutional:      Appearance: Normal appearance. She is well-developed.  HENT:  Head: Normocephalic and atraumatic.  Eyes:     Extraocular Movements: Extraocular movements intact.     Pupils: Pupils are equal, round, and reactive to light.  Cardiovascular:     Rate and Rhythm: Normal rate and regular rhythm.  Pulmonary:     Effort: Pulmonary effort is normal. No respiratory distress.     Breath sounds: Normal breath sounds.  Abdominal:     General: Abdomen is flat.     Palpations: Abdomen is soft.  Genitourinary:    Comments: External: Normal  appearing vulva. No lesions noted.  Speculum examination: Normal appearing cervix. No blood in the vaginal vault. + discharge.   Musculoskeletal:        General: No signs of injury.  Skin:    General: Skin is warm and dry.  Neurological:     Mental Status: She is alert and oriented to person, place, and time.  Psychiatric:        Behavior: Behavior normal.        Thought Content: Thought content normal.        Judgment: Judgment normal.    Wet Prep: Clue Cells: Positive Fungal elements: Negative Trichomonas: Negative   Assessment/Plan:     26 yo with vaginitis Will treat for presumptive bacterial vaginosis. Nuswab and urine culture sent for confirmation.  More than 20 minutes were spent face to face with the patient in the room, reviewing the medical record, labs and images, and coordinating care for the patient. The plan of management was discussed in detail and counseling was provided.    Adrian Prows MD [facility] 11/08/2021 4:26 PM

## 2021-11-10 LAB — NUSWAB VAGINITIS PLUS (VG+)
Candida albicans, NAA: NEGATIVE
Candida glabrata, NAA: NEGATIVE
Chlamydia trachomatis, NAA: NEGATIVE
Neisseria gonorrhoeae, NAA: NEGATIVE
Trich vag by NAA: NEGATIVE

## 2021-11-10 LAB — URINE CULTURE: Organism ID, Bacteria: NO GROWTH

## 2021-12-04 DIAGNOSIS — Z419 Encounter for procedure for purposes other than remedying health state, unspecified: Secondary | ICD-10-CM | POA: Diagnosis not present

## 2022-01-01 DIAGNOSIS — Z419 Encounter for procedure for purposes other than remedying health state, unspecified: Secondary | ICD-10-CM | POA: Diagnosis not present

## 2022-02-01 DIAGNOSIS — Z419 Encounter for procedure for purposes other than remedying health state, unspecified: Secondary | ICD-10-CM | POA: Diagnosis not present

## 2022-03-03 DIAGNOSIS — Z419 Encounter for procedure for purposes other than remedying health state, unspecified: Secondary | ICD-10-CM | POA: Diagnosis not present

## 2022-04-03 DIAGNOSIS — Z419 Encounter for procedure for purposes other than remedying health state, unspecified: Secondary | ICD-10-CM | POA: Diagnosis not present

## 2022-04-29 DIAGNOSIS — L03313 Cellulitis of chest wall: Secondary | ICD-10-CM | POA: Diagnosis not present

## 2022-04-29 DIAGNOSIS — R21 Rash and other nonspecific skin eruption: Secondary | ICD-10-CM | POA: Diagnosis not present

## 2022-05-03 DIAGNOSIS — Z419 Encounter for procedure for purposes other than remedying health state, unspecified: Secondary | ICD-10-CM | POA: Diagnosis not present

## 2022-05-22 ENCOUNTER — Encounter: Payer: Self-pay | Admitting: Family Medicine

## 2022-05-22 ENCOUNTER — Ambulatory Visit (INDEPENDENT_AMBULATORY_CARE_PROVIDER_SITE_OTHER): Payer: Medicaid Other | Admitting: Family Medicine

## 2022-05-22 VITALS — BP 114/76 | HR 79 | Temp 97.3°F | Resp 12 | Ht 61.0 in | Wt 115.0 lb

## 2022-05-22 DIAGNOSIS — Z8614 Personal history of Methicillin resistant Staphylococcus aureus infection: Secondary | ICD-10-CM

## 2022-05-22 DIAGNOSIS — L03111 Cellulitis of right axilla: Secondary | ICD-10-CM | POA: Diagnosis not present

## 2022-05-22 MED ORDER — SULFAMETHOXAZOLE-TRIMETHOPRIM 800-160 MG PO TABS: 1.0000 | ORAL_TABLET | Freq: Two times a day (BID) | ORAL | 0 refills | Status: AC

## 2022-05-22 NOTE — Progress Notes (Unsigned)
   Acute Office Visit  Subjective:     Patient ID: Joyce Montoya, female    DOB: [birth year], 26 y.o.   MRN: [id number]  Chief Complaint  Patient presents with   Cyst    Pt. C/o lump under right armpit x5 days, soreness and red bumps around lump.    HPI Patient is in today for ***  ROS      Objective:    BP 114/76   Pulse 79   Temp (!) 97.3 F (36.3 C)   Resp 12   Ht '5\' 1"'$  (1.549 m)   Wt 115 lb (52.2 kg)   SpO2 100%   BMI 21.73 kg/m  {Vitals History (Optional):23777}  Physical Exam  No results found for any visits on 05/22/22.      Assessment & Plan:   Problem List Items Addressed This Visit   None Visit Diagnoses     Cellulitis of right axilla    -  Primary       Meds ordered this encounter  Medications   sulfamethoxazole-trimethoprim (BACTRIM DS) 800-160 MG tablet    Sig: Take 1 tablet by mouth 2 (two) times daily for 10 days.    Dispense:  20 tablet    Refill:  0    No follow-ups on file.  Elwin Mocha, MD

## 2022-05-31 LAB — AEROBIC CULTURE

## 2022-06-03 DIAGNOSIS — Z419 Encounter for procedure for purposes other than remedying health state, unspecified: Secondary | ICD-10-CM | POA: Diagnosis not present

## 2022-06-27 ENCOUNTER — Emergency Department (HOSPITAL_COMMUNITY)
Admission: EM | Admit: 2022-06-27 | Discharge: 2022-06-27 | Disposition: A | Discharge status: Home / Self Care | Payer: Medicaid Other | Attending: Emergency Medicine | Admitting: Emergency Medicine

## 2022-06-27 ENCOUNTER — Other Ambulatory Visit: Payer: Self-pay

## 2022-06-27 DIAGNOSIS — R531 Weakness: Secondary | ICD-10-CM | POA: Diagnosis not present

## 2022-06-27 DIAGNOSIS — U071 COVID-19: Secondary | ICD-10-CM | POA: Diagnosis not present

## 2022-06-27 DIAGNOSIS — I959 Hypotension, unspecified: Secondary | ICD-10-CM | POA: Diagnosis not present

## 2022-06-27 DIAGNOSIS — R519 Headache, unspecified: Secondary | ICD-10-CM | POA: Diagnosis present

## 2022-06-27 DIAGNOSIS — G4489 Other headache syndrome: Secondary | ICD-10-CM | POA: Diagnosis not present

## 2022-06-27 DIAGNOSIS — Z5321 Procedure and treatment not carried out due to patient leaving prior to being seen by health care provider: Secondary | ICD-10-CM | POA: Insufficient documentation

## 2022-06-27 DIAGNOSIS — Z743 Need for continuous supervision: Secondary | ICD-10-CM | POA: Diagnosis not present

## 2022-06-27 LAB — BASIC METABOLIC PANEL
Anion gap: 8 (ref 5–15)
BUN: <5 mg/dL — ABNORMAL LOW (ref 6–20)
CO2: 24 mmol/L (ref 22–32)
Calcium: 8.9 mg/dL (ref 8.9–10.3)
Chloride: 109 mmol/L (ref 98–111)
Creatinine, Ser: 0.75 mg/dL (ref 0.44–1.00)
GFR, Estimated: >60 mL/min (ref >60)
Glucose, Bld: 88 mg/dL (ref 70–99)
Potassium: 3.9 mmol/L (ref 3.5–5.1)
Sodium: 141 mmol/L (ref 135–145)

## 2022-06-27 LAB — CBC WITH DIFFERENTIAL/PLATELET
Abs Immature Granulocytes: 0.01 10*3/uL (ref 0.00–0.07)
Basophils Absolute: 0 10*3/uL (ref 0.0–0.1)
Basophils Relative: 1 %
Eosinophils Absolute: 0.1 10*3/uL (ref 0.0–0.5)
Eosinophils Relative: 2 %
HCT: 35.9 % — ABNORMAL LOW (ref 36.0–46.0)
Hemoglobin: 11.9 g/dL — ABNORMAL LOW (ref 12.0–15.0)
Immature Granulocytes: 0 %
Lymphocytes Relative: 28 %
Lymphs Abs: 0.8 10*3/uL (ref 0.7–4.0)
MCH: 31.6 pg (ref 26.0–34.0)
MCHC: 33.1 g/dL (ref 30.0–36.0)
MCV: 95.2 fL (ref 80.0–100.0)
Monocytes Absolute: 0.2 10*3/uL (ref 0.1–1.0)
Monocytes Relative: 7 %
Neutro Abs: 1.7 10*3/uL (ref 1.7–7.7)
Neutrophils Relative %: 62 %
Platelets: 163 10*3/uL (ref 150–400)
RBC: 3.77 MIL/uL — ABNORMAL LOW (ref 3.87–5.11)
RDW: 12 % (ref 11.5–15.5)
WBC: 2.8 10*3/uL — ABNORMAL LOW (ref 4.0–10.5)
nRBC: 0 % (ref 0.0–0.2)

## 2022-06-27 LAB — SARS CORONAVIRUS 2 BY RT PCR: SARS Coronavirus 2 by RT PCR: POSITIVE — AB

## 2022-06-27 NOTE — ED Notes (Signed)
Pt here c/o COVID symptoms - HA, generalized body aches, sore throat, chills - that started yesterday morning. Pt reports that her boyfriend tested positive for COVID on Tuesday. Pt also concerned about possible MRSA infection. Pt has raised red area on L groin that she noticed 2 days ago, pt reports hx of MRSA, was on an antibiotics for similar bump under R arm but did not finish antibiotics dose due to side effects.

## 2022-06-27 NOTE — ED Triage Notes (Signed)
Pt BIB [facility] from home c/o a headache after her entire family tested positive for COVID yesterday. Pt received 500 NS with EMS.

## 2022-06-27 NOTE — ED Notes (Signed)
Pt stated she was leaving.  

## 2022-06-27 NOTE — ED Provider Triage Note (Addendum)
Emergency Medicine Provider Triage Evaluation Note  Joyce Montoya , a 26 y.o. female  was evaluated in triage.  Pt complains of concerns for possibly having COVID.  Her family tested positive for COVID yesterday.  She did not test herself.  Has associated headache as well as generalized body aches.  No meds tried prior to arrival.  Also voices concern for MRSA as she has an area that is a boil to her groin region.  Was placed on doxycycline and Bactrim in July however did not finish the antibiotic of Bactrim due to side effects.  Review of Systems  Positive: As per HPI Negative:   Physical Exam  BP 110/74 (BP Location: Right Arm)   Pulse 91   Temp 99.2 F (37.3 C) (Oral)   Resp 18   LMP  (LMP Unknown) Comment: "a couple weeks ago"  SpO2 98%  Gen:   Awake, no distress   Resp:  Normal effort  MSK:   Moves extremities without difficulty  Other:  GU exam deferred.  Medical Decision Making  Medically screening exam initiated at 3:44 PM.  Appropriate orders placed.  Joyce Montoya was informed that the remainder of the evaluation will be completed by another provider, this initial triage assessment does not replace that evaluation, and the importance of remaining in the ED until their evaluation is complete.  Work-up initiated   Tobi Leinweber A, PA-C 06/27/22 1549    Allyne Hebert A, PA-C 06/27/22 1602

## 2022-06-28 ENCOUNTER — Encounter: Payer: Self-pay | Admitting: Family Medicine

## 2022-06-28 ENCOUNTER — Telehealth: Payer: Medicaid Other | Admitting: Family Medicine

## 2022-07-04 DIAGNOSIS — Z419 Encounter for procedure for purposes other than remedying health state, unspecified: Secondary | ICD-10-CM | POA: Diagnosis not present

## 2022-07-22 ENCOUNTER — Ambulatory Visit: Payer: Medicaid Other | Admitting: Family Medicine

## 2022-07-22 VITALS — HR 102 | Resp 16 | Wt 112.8 lb

## 2022-07-22 DIAGNOSIS — Z8614 Personal history of Methicillin resistant Staphylococcus aureus infection: Secondary | ICD-10-CM | POA: Diagnosis not present

## 2022-07-22 DIAGNOSIS — Z22322 Carrier or suspected carrier of Methicillin resistant Staphylococcus aureus: Secondary | ICD-10-CM

## 2022-07-22 MED ORDER — MUPIROCIN 2 % EX OINT: 1.0000 | TOPICAL_OINTMENT | Freq: Two times a day (BID) | CUTANEOUS | 0 refills | Status: AC

## 2022-07-22 NOTE — Progress Notes (Unsigned)
   Established Patient Office Visit  Subjective   Patient ID: ODILIA DAMICO, female    DOB: 24-Nov-1995  Age: 26 y.o. MRN: [id number]  Chief Complaint  Patient presents with   Follow-up    MRSA treatment    HPI  {History (Optional):23778}  ROS    Objective:     Pulse (!) 102   Resp 16   Wt 112 lb 12.8 oz (51.2 kg)   LMP  (LMP Unknown) Comment: "a couple weeks ago"  SpO2 99%   BMI 21.31 kg/m  {Vitals History (Optional):23777}  Physical Exam   No results found for any visits on 07/22/22.  {Labs (Optional):23779}  The ASCVD Risk score (Arnett DK, et al., 2019) failed to calculate for the following reasons:   The 2019 ASCVD risk score is only valid for ages 36 to 1    Assessment & Plan:   Problem List Items Addressed This Visit   None Visit Diagnoses     MRSA carrier    -  Primary   Relevant Medications   mupirocin ointment (BACTROBAN) 2 %       No follow-ups on file.    Elwin Mocha, MD

## 2022-07-25 LAB — MRSA CULTURE: MRSA Screen: NEGATIVE

## 2022-08-03 DIAGNOSIS — Z419 Encounter for procedure for purposes other than remedying health state, unspecified: Secondary | ICD-10-CM | POA: Diagnosis not present

## 2022-08-06 ENCOUNTER — Ambulatory Visit: Payer: Medicaid Other | Admitting: Family Medicine

## 2022-08-19 ENCOUNTER — Ambulatory Visit (INDEPENDENT_AMBULATORY_CARE_PROVIDER_SITE_OTHER): Payer: Medicaid Other | Admitting: Dermatology

## 2022-08-19 ENCOUNTER — Encounter: Payer: Self-pay | Admitting: Dermatology

## 2022-08-19 DIAGNOSIS — D2239 Melanocytic nevi of other parts of face: Secondary | ICD-10-CM

## 2022-08-19 DIAGNOSIS — D229 Melanocytic nevi, unspecified: Secondary | ICD-10-CM

## 2022-08-19 DIAGNOSIS — L719 Rosacea, unspecified: Secondary | ICD-10-CM

## 2022-08-19 DIAGNOSIS — L2489 Irritant contact dermatitis due to other agents: Secondary | ICD-10-CM

## 2022-08-19 DIAGNOSIS — L700 Acne vulgaris: Secondary | ICD-10-CM | POA: Diagnosis not present

## 2022-08-19 DIAGNOSIS — D1801 Hemangioma of skin and subcutaneous tissue: Secondary | ICD-10-CM | POA: Diagnosis not present

## 2022-08-19 MED ORDER — TRETINOIN 0.025 % EX CREA: TOPICAL_CREAM | CUTANEOUS | 3 refills | Status: DC

## 2022-08-19 NOTE — Patient Instructions (Addendum)
Start Tretinoin 0.25% cream pea-sized amount at bedtime, wash off in morning.  Recommend Vanicream OR La Roche Posay gentle cleasnsers Recommend using Alastin Vitamin C serum.  Topical retinoid medications like tretinoin/Retin-A, adapalene/Differin, tazarotene/Fabior, and Epiduo/Epiduo Forte can cause dryness and irritation when first started. Only apply a pea-sized amount to the entire affected area. Avoid applying it around the eyes, edges of mouth and creases at the nose. If you experience irritation, use a good moisturizer first and/or apply the medicine less often. If you are doing well with the medicine, you can increase how often you use it until you are applying every night. Be careful with sun protection while using this medication as it can make you sensitive to the sun. This medicine should not be used by pregnant women.   Oxymetazoline: 1%, Ivermectin: 1%, Niacinamide: 2% cream once daily to face for redness.   Instructions for [facility] Medications  One or more of your medications was sent to the [facility] mail order compounding pharmacy. You will receive an email from them and can purchase the medicine through that link. It will then be mailed to your home at the address you confirmed. If for any reason you do not receive an email from them, please check your spam folder. If you still do not find the email, please let us know. [facility] phone number is [phone].    Recommend daily broad spectrum sunscreen SPF 30+ to sun-exposed areas, reapply every 2 hours as needed. Call for new or changing lesions.  Staying in the shade or wearing long sleeves, sun glasses (UVA+UVB protection) and wide brim hats (4-inch brim around the entire circumference of the hat) are also recommended for sun protection.    Due to recent changes in healthcare laws, you may see results of your pathology and/or laboratory studies on MyChart before the doctors have had a chance to review them.  We understand that in some cases there may be results that are confusing or concerning to you. Please understand that not all results are received at the same time and often the doctors may need to interpret multiple results in order to provide you with the best plan of care or course of treatment. Therefore, we ask that you please give [place] 2 business days to thoroughly review all your results before contacting the office for clarification. Should we see a critical lab result, you will be contacted sooner.   If You Need Anything After Your Visit  If you have any questions or concerns for your doctor, please call our main line at [phone] and press option 4 to reach your doctor's medical assistant. If no one answers, please leave a voicemail as directed and we will return your call as soon as possible. Messages left after 4 pm will be answered the following business day.   You may also send [place] a message via [place]. We typically respond to MyChart messages within 1-2 business days.  For prescription refills, please ask your pharmacy to contact our office. Our fax number is [phone].  If you have an urgent issue when the clinic is closed that cannot wait until the next business day, you can page your doctor at the number below.    Please note that while we do our best to be available for urgent issues outside of office hours, we are not available 24/7.   If you have an urgent issue and are unable to reach [place], you may choose to seek medical care at your  doctor's office, retail clinic, urgent care center, or emergency room.  If you have a medical emergency, please immediately call 911 or go to the emergency department.  Pager Numbers  - Dr. Nehemiah Massed: [phone]  - Dr. Laurence Ferrari: [phone]  - Dr. Kinisha Kindred: [phone]  In the event of inclement weather, please call our main line at [phone] for an update on the status of any delays or closures.  Dermatology Medication Tips: Please  keep the boxes that topical medications come in in order to help keep track of the instructions about where and how to use these. Pharmacies typically print the medication instructions only on the boxes and not directly on the medication tubes.   If your medication is too expensive, please contact our office at [phone] option 4 or send [place] a message through [place].   We are unable to tell what your co-pay for medications will be in advance as this is different depending on your insurance coverage. However, we may be able to find a substitute medication at lower cost or fill out paperwork to get insurance to cover a needed medication.   If a prior authorization is required to get your medication covered by your insurance company, please allow [place] 1-2 business days to complete this process.  Drug prices often vary depending on where the prescription is filled and some pharmacies may offer cheaper prices.  The website www.goodrx.com contains coupons for medications through different pharmacies. The prices here do not account for what the cost may be with help from insurance (it may be cheaper with your insurance), but the website can give you the price if you did not use any insurance.  - You can print the associated coupon and take it with your prescription to the pharmacy.  - You may also stop by our office during regular business hours and pick up a GoodRx coupon card.  - If you need your prescription sent electronically to a different pharmacy, notify our office through [facility] or by phone at [phone] option 4.     Si Usted Necesita Algo Despus de Su Visita  Tambin puede enviarnos un mensaje a travs de Pharmacist, community. Por lo general respondemos a los mensajes de MyChart en el transcurso de 1 a 2 das hbiles.  Para renovar recetas, por favor pida a su farmacia que se ponga en contacto con nuestra oficina. Harland Dingwall de fax es [place] [phone].  Si tiene un asunto urgente  cuando la clnica est cerrada y que no puede esperar hasta el siguiente da hbil, puede llamar/localizar a su doctor(a) al nmero que aparece a continuacin.   Por favor, tenga en cuenta que aunque hacemos todo lo posible para estar disponibles para asuntos urgentes fuera del horario de [place], no estamos disponibles las 24 horas del da, los 7 das de la [place].   Si tiene un problema urgente y no puede comunicarse con nosotros, puede optar por buscar atencin mdica  en el consultorio de su doctor(a), en una clnica privada, en un centro de atencin urgente o en una sala de emergencias.  Si tiene Engineering geologist, por favor llame inmediatamente al 911 o vaya a la sala de emergencias.  Nmeros de bper  - Dr. Nehemiah Massed: [phone]  - Dra. Moye: [phone]  - Dra. Tiffiany Kindred: [phone]  En caso de inclemencias del [place], por favor llame a Johnsie Kindred principal al [phone] para una actualizacin sobre el [place] de cualquier retraso o cierre.  Consejos para la medicacin en dermatologa: Por  favor, guarde las cajas en las que vienen los medicamentos de uso tpico para ayudarle a seguir las instrucciones sobre dnde y cmo usarlos. Las farmacias generalmente imprimen las instrucciones del medicamento slo en las cajas y no directamente en los tubos del [place].   Si su medicamento es muy caro, por favor, pngase en contacto con Zigmund Daniel llamando al [phone] y presione la opcin 4 o envenos un mensaje a travs de Pharmacist, community.   No podemos decirle cul ser su copago por los medicamentos por adelantado ya que esto es diferente dependiendo de la cobertura de su seguro. Sin embargo, es posible que podamos encontrar un medicamento sustituto a Electrical engineer un formulario para que el seguro cubra el medicamento que se considera necesario.   Si se requiere una autorizacin previa para que su compaa de seguros [place] su medicamento, por favor permtanos de 1 a 2  das hbiles para completar este proceso.  Los precios de los medicamentos varan con frecuencia dependiendo del Environmental consultant de dnde se surte la receta y alguna farmacias pueden ofrecer precios ms baratos.  El sitio web www.goodrx.com tiene cupones para medicamentos de Airline pilot. Los precios aqu no tienen en cuenta lo que podra costar con la ayuda del seguro (puede ser ms barato con su seguro), pero el sitio web puede darle el precio si no utiliz Research scientist (physical sciences).  - Puede imprimir el cupn correspondiente y llevarlo con su receta a la farmacia.  - Tambin puede pasar por nuestra oficina durante el horario de atencin regular y Charity fundraiser una tarjeta de cupones de GoodRx.  - Si necesita que su receta se enve electrnicamente a una farmacia diferente, informe a nuestra oficina a travs de MyChart de [place] o por telfono llamando al [phone] y presione la opcin 4.

## 2022-08-19 NOTE — Progress Notes (Signed)
New Patient Visit  Subjective  Joyce Montoya is a 26 y.o. female who presents for the following: Skin Problem (Check face. C/O acne, broken blood vessels, moles. Bothersome. Would like to discuss treatment for all these lesions).  The patient has spots, moles and lesions to be evaluated, some may be new or changing and the patient has concerns that these could be cancer.   Review of Systems: No other skin or systemic complaints except as noted in HPI or Assessment and Plan.   Objective  Well appearing patient in no apparent distress; mood and affect are within normal limits.  A focused examination was performed including face, neck. Relevant physical exam findings are noted in the Assessment and Plan.  face Trace to 1+ open comedones  face Spider angioma  cheeks Flesh colored, regular papules  face Macular redness at cheeks   Assessment & Plan  Acne vulgaris face  Chronic and persistent condition with duration or expected duration over one year. Condition is symptomatic/ bothersome to patient. Not currently at goal.  Start Tretinoin 0.25% cream pea-sized amount at bedtime, wash off in morning.   Topical retinoid medications like tretinoin/Retin-A, adapalene/Differin, tazarotene/Fabior, and Epiduo/Epiduo Forte can cause dryness and irritation when first started. Only apply a pea-sized amount to the entire affected area. Avoid applying it around the eyes, edges of mouth and creases at the nose. If you experience irritation, use a good moisturizer first and/or apply the medicine less often. If you are doing well with the medicine, you can increase how often you use it until you are applying every night. Be careful with sun protection while using this medication as it can make you sensitive to the sun. This medicine should not be used by pregnant women.    tretinoin (RETIN-A) 0.025 % cream - face Apply pea-sized amount to face at bedtime, wash off in morning.  Hemangioma of  skin face  Spider Hemangiomas  Discussed the treatment option of BBL/laser.  Typically we recommend 1-3 treatment sessions about 5-8 weeks apart for best results.  The patient's condition may require "maintenance treatments" in the future.  The fee for BBL / laser treatments is $350 per treatment session for the whole face.  A fee can be quoted for other parts of the body. Insurance typically does not pay for BBL/laser treatments and therefore the fee is an out-of-pocket cost.   Multiple nevi cheeks  Benign-appearing.  Observation.  Call clinic for new or changing lesions.   Do not recommend removal due to scarring, risk of recurrence.   Rosacea face  Chronic and persistent condition with duration or expected duration over one year. Condition is bothersome/symptomatic for patient. Currently flared.  Rosacea is a chronic progressive skin condition usually affecting the face of adults, causing redness and/or acne bumps. It is treatable but not curable. It sometimes affects the eyes (ocular rosacea) as well. It may respond to topical and/or systemic medication and can flare with stress, sun exposure, alcohol, exercise, topical steroids (including hydrocortisone/cortisone 10) and some foods.  Daily application of broad spectrum spf 30+ sunscreen to face is recommended to reduce flares.  Discussed the treatment option of BBL/laser.  Typically we recommend 1-3 treatment sessions about 5-8 weeks apart for best results.  The patient's condition may require "maintenance treatments" in the future.  The fee for BBL / laser treatments is $350 per treatment session for the whole face.  A fee can be quoted for other parts of the body. Insurance typically does  not pay for BBL/laser treatments and therefore the fee is an out-of-pocket cost.  Start prescription Oxymetazoline: 1%, Ivermectin: 1%, Niacinamide: 2% cream once daily to face for redness.   Instructions for [facility] Medications  One or  more of your medications was sent to the [facility] mail order compounding pharmacy. You will receive an email from them and can purchase the medicine through that link. It will then be mailed to your home at the address you confirmed. If for any reason you do not receive an email from them, please check your spam folder. If you still do not find the email, please let us know. [facility] phone number is [phone].   Irritant contact dermatitis due to other agents Head - Anterior (Face)  Recommend Vanicream OR La Roche Posay gentle cleansers Recommend using Alastin Vitamin C serum as gentler than some other vitamin C options   Return for Acne Follow Up 3 months.   I, Emelia Salisbury, CMA, am acting as scribe for Forest Gleason, MD.  Documentation: I have reviewed the above documentation for accuracy and completeness, and I agree with the above.  Forest Gleason, MD

## 2022-08-27 ENCOUNTER — Encounter: Payer: Self-pay | Admitting: Dermatology

## 2022-09-03 DIAGNOSIS — Z419 Encounter for procedure for purposes other than remedying health state, unspecified: Secondary | ICD-10-CM | POA: Diagnosis not present

## 2022-09-19 ENCOUNTER — Other Ambulatory Visit (INDEPENDENT_AMBULATORY_CARE_PROVIDER_SITE_OTHER): Payer: Self-pay | Admitting: Family Medicine

## 2022-09-19 DIAGNOSIS — Z789 Other specified health status: Secondary | ICD-10-CM

## 2022-09-19 MED ORDER — NORETHIN ACE-ETH ESTRAD-FE 1-20 MG-MCG PO TABS: 1.0000 | ORAL_TABLET | Freq: Every day | ORAL | 3 refills | Status: AC

## 2022-09-19 NOTE — Telephone Encounter (Signed)
Refilled BC. Pt to f/u next week.  Elwin Mocha, MD

## 2022-10-03 DIAGNOSIS — Z419 Encounter for procedure for purposes other than remedying health state, unspecified: Secondary | ICD-10-CM | POA: Diagnosis not present

## 2022-11-03 DIAGNOSIS — Z419 Encounter for procedure for purposes other than remedying health state, unspecified: Secondary | ICD-10-CM | POA: Diagnosis not present

## 2022-11-20 ENCOUNTER — Ambulatory Visit: Payer: Medicaid Other | Admitting: Dermatology

## 2022-11-21 ENCOUNTER — Ambulatory Visit (INDEPENDENT_AMBULATORY_CARE_PROVIDER_SITE_OTHER): Payer: Medicaid Other | Admitting: Family Medicine

## 2022-11-21 VITALS — BP 110/77 | HR 85 | Temp 97.2°F | Ht 61.0 in | Wt 118.0 lb

## 2022-11-21 DIAGNOSIS — G43009 Migraine without aura, not intractable, without status migrainosus: Secondary | ICD-10-CM

## 2022-11-21 DIAGNOSIS — N939 Abnormal uterine and vaginal bleeding, unspecified: Secondary | ICD-10-CM | POA: Diagnosis not present

## 2022-11-21 DIAGNOSIS — R42 Dizziness and giddiness: Secondary | ICD-10-CM

## 2022-11-21 DIAGNOSIS — R1032 Left lower quadrant pain: Secondary | ICD-10-CM

## 2022-11-21 LAB — POCT URINE PREGNANCY: Preg Test, Ur: NEGATIVE

## 2022-11-21 MED ORDER — BUTALBITAL-APAP-CAFFEINE 50-325-40 MG PO TABS: 1.0000 | ORAL_TABLET | Freq: Four times a day (QID) | ORAL | 0 refills | Status: DC | PRN

## 2022-11-21 NOTE — Patient Instructions (Signed)
Both daily

## 2022-11-21 NOTE — Progress Notes (Signed)
Established Patient Office Visit  Subjective   Patient ID: Joyce Montoya, female    DOB: [birth year]  Age: 27 y.o. MRN: [id number]  Chief Complaint  Patient presents with  . Headache    Pt. C/o HA, dizzy, nausea X2 days     CC: dizziness  Abdominal Pain This is a chronic problem. The current episode started more than 1 month ago. The onset quality is sudden. The problem occurs every several days. The problem has been unchanged. The pain is located in the LLQ. The pain is moderate. The quality of the pain is aching. The abdominal pain does not radiate. Pertinent negatives include no constipation, diarrhea, dysuria, fever, frequency, hematochezia, hematuria, nausea, vomiting or weight loss.  Dizziness This is a chronic problem. The current episode started more than 1 month ago. The problem occurs daily. The problem has been gradually improving. Associated symptoms include abdominal pain. Pertinent negatives include no chest pain, congestion, coughing, diaphoresis, fatigue, fever, nausea, numbness, urinary symptoms, vertigo, visual change, vomiting or weakness.  Migraine  This is a chronic problem. The current episode started more than 1 year ago. The problem occurs intermittently. The problem has been gradually worsening. The pain is located in the Frontal and bilateral region. The pain does not radiate. The pain quality is similar to prior headaches. The quality of the pain is described as aching. The pain is severe. Associated symptoms include abdominal pain, blurred vision and dizziness. Pertinent negatives include no coughing, eye pain, fever, insomnia, nausea, numbness, phonophobia, photophobia, visual change, vomiting, weakness or weight loss. The symptoms are aggravated by activity. She has tried darkened room and NSAIDs (BB, triptans) for the symptoms. The treatment provided mild relief. Her past medical history is significant for migraine headaches. There is no history of hypertension,  immunosuppression, obesity, recent head traumas, sinus disease or TMJ.  Vaginal Bleeding The patient's primary symptoms include pelvic pain and vaginal bleeding. The patient's pertinent negatives include no genital rash or vaginal discharge. This is a chronic problem. The current episode started more than 1 year ago. Episode frequency: Now up to twice a monthg. The problem has been gradually worsening. She is not pregnant. Associated symptoms include abdominal pain. Pertinent negatives include no constipation, diarrhea, dysuria, fever, frequency, hematuria, nausea or vomiting. She has not been passing clots. She has not been passing tissue. She has tried NSAIDs (low dose OCP) for the symptoms. The treatment provided mild relief. She is sexually active. She uses oral contraceptives for contraception. Her menstrual history has been irregular.      Review of Systems  Constitutional:  Negative for diaphoresis, fatigue, fever and weight loss.  HENT:  Negative for congestion.   Eyes:  Positive for blurred vision. Negative for photophobia and pain.  Respiratory:  Negative for cough.   Cardiovascular:  Negative for chest pain.  Gastrointestinal:  Positive for abdominal pain. Negative for constipation, diarrhea, hematochezia, nausea and vomiting.  Genitourinary:  Positive for pelvic pain and vaginal bleeding. Negative for dysuria, frequency, hematuria and vaginal discharge.  Neurological:  Positive for dizziness. Negative for vertigo, weakness and numbness.  Psychiatric/Behavioral:  The patient does not have insomnia.   All other systems reviewed and are negative.     Objective:     BP 110/77   Pulse 85   Temp (!) 97.2 F (36.2 C)   Ht '5\' 1"'$  (1.549 m)   Wt 118 lb (53.5 kg)   SpO2 98%   BMI 22.30 kg/m    Physical Exam  Vitals and nursing note reviewed.  Constitutional:      General: She is not in acute distress.    Appearance: Normal appearance. She is not ill-appearing.  HENT:      Head: Normocephalic and atraumatic.     Mouth/Throat:     Mouth: Mucous membranes are moist.     Pharynx: Oropharynx is clear.  Eyes:     Extraocular Movements: Extraocular movements intact.     Pupils: Pupils are equal, round, and reactive to light.  Cardiovascular:     Rate and Rhythm: Normal rate and regular rhythm.     Heart sounds: No murmur heard.    No friction rub. No gallop.  Pulmonary:     Effort: Pulmonary effort is normal.     Breath sounds: Normal breath sounds.  Abdominal:     General: Abdomen is flat.     Palpations: Abdomen is soft.     Tenderness: There is no abdominal tenderness.  Skin:    General: Skin is warm.     Capillary Refill: Capillary refill takes less than 2 seconds.     Findings: No rash.  Neurological:     General: No focal deficit present.     Mental Status: She is alert and oriented to person, place, and time.  Psychiatric:        Mood and Affect: Mood normal.        Behavior: Behavior normal.     No results found for any visits on 11/21/22.    The ASCVD Risk score (Arnett DK, et al., 2019) failed to calculate for the following reasons:   The 2019 ASCVD risk score is only valid for ages 89 to 68    Assessment & Plan:   Problem List Items Addressed This Visit   None Visit Diagnoses     LLQ pain    -  Primary   Relevant Orders   Basic metabolic panel (Completed)   CBC with Differential/Platelet (Completed)   Dizziness       Relevant Orders   Basic metabolic panel (Completed)   CBC with Differential/Platelet (Completed)   POCT urine pregnancy (Completed)   EKG 12-Lead   Abnormal uterine bleeding       Relevant Orders   Basic metabolic panel (Completed)   CBC with Differential/Platelet (Completed)   POCT urine pregnancy (Completed)   Migraine without aura and without status migrainosus, not intractable       Relevant Medications   butalbital-acetaminophen-caffeine (FIORICET) 50-325-40 MG tablet   Other Relevant Orders   POCT  urine pregnancy (Completed)   MR Brain Wo Contrast      - Pt has had longstanding LLQ pain.  Exact cause has not been found. Possibly GU. Pt reports hx of normal img. Will get labs to resume w/u on issue. Will also look into a records req.   -EKG shows normal sinus rhythm.  No arrhythmia.  No PVCs or PACs.  Patient reassured.  Due to patient's history of abnormal bleeding will check a CBC.  Also will check a metabolic panel as patient could have electrolyte abnormalities as well.  - Discussed case with OBGYN Dr. Jimmye Norman who will see pt in consult. We discussed the case and the pt's sx. Pt mother had abn uternine tumors in her early 18s. TVUs ordered per his rec. Also a CA19-9 and CA 125 per his rec.  - Patient has chronic headaches and migraines.  Will do a trial of Fioricet for now.  If this does  not work I have advised patient that she can come in for a migraine cocktail at a later date.  At that point we will begin the push her workup.  Due to reported intracranial hypertension size on her MRI 12/2020.  Will get a repeat.  Imaging from before could represent early start of the disease process.  Per the patient her neurologist at the time leaning towards monitoring versus treatment.  But she has not seen that provider in almost 2 years now.  Symptoms have continued. Pt feel HAs are more freq.  Return in about 1 month (around 12/22/2022) for dizziness.   Total time was 67 minutes.  That included chart review before the visit, actual patient visit and the time spent on documentation after the visit.  Elwin Mocha, MD

## 2022-11-22 LAB — BASIC METABOLIC PANEL
BUN/Creatinine Ratio: 22 (ref 9–23)
BUN: 15 mg/dL (ref 6–20)
CO2: 22 mmol/L (ref 20–29)
Calcium: 8.7 mg/dL (ref 8.7–10.2)
Chloride: 107 mmol/L — ABNORMAL HIGH (ref 96–106)
Creatinine, Ser: 0.67 mg/dL (ref 0.57–1.00)
Glucose: 85 mg/dL (ref 70–99)
Potassium: 4.5 mmol/L (ref 3.5–5.2)
Sodium: 141 mmol/L (ref 134–144)
eGFR: 122 mL/min/{1.73_m2} (ref >59)

## 2022-11-22 LAB — CBC WITH DIFFERENTIAL/PLATELET
Basophils Absolute: 0.1 10*3/uL (ref 0.0–0.2)
Basos: 1 %
EOS (ABSOLUTE): 0.3 10*3/uL (ref 0.0–0.4)
Eos: 5 %
Hematocrit: 38 % (ref 34.0–46.6)
Hemoglobin: 12.7 g/dL (ref 11.1–15.9)
Immature Grans (Abs): 0 10*3/uL (ref 0.0–0.1)
Immature Granulocytes: 0 %
Lymphocytes Absolute: 2.1 10*3/uL (ref 0.7–3.1)
Lymphs: 34 %
MCH: 30.7 pg (ref 26.6–33.0)
MCHC: 33.4 g/dL (ref 31.5–35.7)
MCV: 92 fL (ref 79–97)
Monocytes Absolute: 0.4 10*3/uL (ref 0.1–0.9)
Monocytes: 6 %
Neutrophils Absolute: 3.4 10*3/uL (ref 1.4–7.0)
Neutrophils: 54 %
Platelets: 238 10*3/uL (ref 150–450)
RBC: 4.14 x10E6/uL (ref 3.77–5.28)
RDW: 11.9 % (ref 11.7–15.4)
WBC: 6.3 10*3/uL (ref 3.4–10.8)

## 2022-11-25 ENCOUNTER — Encounter: Payer: Self-pay | Admitting: Family Medicine

## 2022-11-25 ENCOUNTER — Ambulatory Visit (INDEPENDENT_AMBULATORY_CARE_PROVIDER_SITE_OTHER): Payer: Medicaid Other | Admitting: Family Medicine

## 2022-11-25 ENCOUNTER — Ambulatory Visit: Payer: Medicaid Other | Admitting: Family Medicine

## 2022-11-25 VITALS — BP 98/72 | HR 83 | Temp 98.4°F | Resp 16 | Ht 61.0 in | Wt 118.4 lb

## 2022-11-25 DIAGNOSIS — G43909 Migraine, unspecified, not intractable, without status migrainosus: Secondary | ICD-10-CM

## 2022-11-28 NOTE — Progress Notes (Signed)
   Acute Office Visit  Subjective:     Patient ID: Joyce Montoya, female    DOB: December 22, 1995, 27 y.o.   MRN: [id number]  No chief complaint on file.   CC: Migraine  Pt with acute onset of migraine. These are freq. No hx of trying migraine cocktail. Has been on triptans with limited relief for over 6 mo. Pt migraines started with removal of tonsils.  Pt is hear today req acute migraine relief after failure of home tx.      Review of Systems  All other systems reviewed and are negative.       Objective:    BP 98/72 (BP Location: Left Arm, Patient Position: Sitting)   Pulse 83   Temp 98.4 F (36.9 C) (Temporal)   Resp 16   Ht '5\' 1"'$  (1.549 m)   Wt 118 lb 6.4 oz (53.7 kg)   SpO2 98%   BMI 22.37 kg/m    Physical Exam Vitals and nursing note reviewed.  Constitutional:      General: She is not in acute distress.    Appearance: Normal appearance. She is ill-appearing.  HENT:     Head: Normocephalic and atraumatic.     Mouth/Throat:     Mouth: Mucous membranes are moist.     Pharynx: Oropharynx is clear.  Eyes:     Extraocular Movements: Extraocular movements intact.     Pupils: Pupils are equal, round, and reactive to light.  Cardiovascular:     Rate and Rhythm: Normal rate and regular rhythm.     Heart sounds: No murmur heard.    No friction rub. No gallop.  Pulmonary:     Effort: Pulmonary effort is normal.     Breath sounds: Normal breath sounds.  Abdominal:     General: Abdomen is flat. There is no distension.  Skin:    General: Skin is warm.     Capillary Refill: Capillary refill takes less than 2 seconds.     Findings: No rash.  Neurological:     General: No focal deficit present.     Mental Status: She is alert and oriented to person, place, and time.     Cranial Nerves: No cranial nerve deficit.     Motor: No weakness.     Gait: Gait normal.  Psychiatric:        Mood and Affect: Mood normal.        Behavior: Behavior normal.     No results  found for any visits on 11/25/22.      Assessment & Plan:   Problem List Items Addressed This Visit   None Visit Diagnoses     Acute migraine    -  Primary      - given Reglan '25mg'$  IM, asa '324mg'$  po, solumedrol '125mg'$  IM as migraine cocktail, sig other drove pt home, monitored in clinic post inj per protocol - just got off of cycle  DDx poss: includes sinusitis as this could explain possible change after removal of tonsils, also MRI from 2022 states signs of idiopathic intracranial hypertension  Will f/u post cocktail  No orders of the defined types were placed in this encounter.   Return in about 1 month (around 12/26/2022) for migraines.  Elwin Mocha, MD

## 2022-12-01 ENCOUNTER — Encounter: Payer: Self-pay | Admitting: Family Medicine

## 2022-12-02 ENCOUNTER — Ambulatory Visit (INDEPENDENT_AMBULATORY_CARE_PROVIDER_SITE_OTHER): Payer: Medicaid Other | Admitting: Family Medicine

## 2022-12-02 VITALS — BP 110/78 | HR 102 | Temp 98.4°F | Ht 61.0 in | Wt 118.0 lb

## 2022-12-02 DIAGNOSIS — G4489 Other headache syndrome: Secondary | ICD-10-CM

## 2022-12-02 DIAGNOSIS — G43909 Migraine, unspecified, not intractable, without status migrainosus: Secondary | ICD-10-CM

## 2022-12-02 MED ORDER — PREDNISONE 20 MG PO TABS: 60.0000 mg | ORAL_TABLET | Freq: Every day | ORAL | 0 refills | Status: AC

## 2022-12-02 MED ORDER — KETOROLAC TROMETHAMINE 10 MG PO TABS: 10.0000 mg | ORAL_TABLET | Freq: Four times a day (QID) | ORAL | 0 refills | Status: DC | PRN

## 2022-12-02 NOTE — Progress Notes (Signed)
Acute Office Visit  Subjective:     Patient ID: Joyce Montoya, female    DOB: Apr 30, 1996, 27 y.o.   MRN: [id number]  No chief complaint on file.   Migraine  This is a recurrent problem. The current episode started today. The problem occurs constantly. The problem has been unchanged. The pain is located in the Bilateral region. The pain does not radiate. The pain quality is similar to prior headaches. The quality of the pain is described as aching. The pain is at a severity of 9/10. Pertinent negatives include no abdominal pain, fever, loss of balance or numbness. Her past medical history is significant for migraine headaches. There is no history of cluster headaches or obesity.     Review of Systems  Constitutional:  Negative for fever.  Gastrointestinal:  Negative for abdominal pain.  Neurological:  Negative for numbness and loss of balance.  All other systems reviewed and are negative.       Objective:    BP 110/78 (BP Location: Left Arm, Patient Position: Sitting, Cuff Size: Normal)   Pulse (!) 102   Temp 98.4 F (36.9 C)   Ht '5\' 1"'$  (1.549 m)   Wt 118 lb (53.5 kg)   BMI 22.30 kg/m    Physical Exam Vitals and nursing note reviewed.  Constitutional:      General: She is not in acute distress.    Appearance: Normal appearance. She is not ill-appearing.  HENT:     Head: Normocephalic and atraumatic.     Mouth/Throat:     Mouth: Mucous membranes are moist.     Pharynx: Oropharynx is clear.  Eyes:     Extraocular Movements: Extraocular movements intact.     Pupils: Pupils are equal, round, and reactive to light.  Cardiovascular:     Rate and Rhythm: Normal rate and regular rhythm.     Heart sounds: No murmur heard.    No friction rub. No gallop.  Pulmonary:     Effort: Pulmonary effort is normal.     Breath sounds: Normal breath sounds.  Abdominal:     General: Abdomen is flat.     Palpations: Abdomen is soft.     Tenderness: There is no abdominal tenderness.   Skin:    General: Skin is warm.     Capillary Refill: Capillary refill takes less than 2 seconds.     Findings: No rash.  Neurological:     General: No focal deficit present.     Mental Status: She is alert and oriented to person, place, and time.  Psychiatric:        Mood and Affect: Mood normal.        Behavior: Behavior normal.     No results found for any visits on 12/02/22.      Assessment & Plan:   Problem List Items Addressed This Visit   None Visit Diagnoses     Acute migraine    -  Primary   Relevant Medications   ketorolac (TORADOL) 10 MG tablet   predniSONE (DELTASONE) 20 MG tablet       Meds ordered this encounter  Medications   ketorolac (TORADOL) 10 MG tablet    Sig: Take 1 tablet (10 mg total) by mouth every 6 (six) hours as needed.    Dispense:  20 tablet    Refill:  0   predniSONE (DELTASONE) 20 MG tablet    Sig: Take 3 tablets (60 mg total) by mouth daily with breakfast  for 5 days.    Dispense:  15 tablet    Refill:  0   - trial of diff meds for migraine cocktail  Return if symptoms worsen or fail to improve.  Elwin Mocha, MD

## 2022-12-04 DIAGNOSIS — R1032 Left lower quadrant pain: Secondary | ICD-10-CM | POA: Diagnosis not present

## 2022-12-04 DIAGNOSIS — N939 Abnormal uterine and vaginal bleeding, unspecified: Secondary | ICD-10-CM | POA: Diagnosis not present

## 2022-12-04 DIAGNOSIS — Z419 Encounter for procedure for purposes other than remedying health state, unspecified: Secondary | ICD-10-CM | POA: Diagnosis not present

## 2022-12-04 NOTE — Addendum Note (Signed)
Addended by: Elwin Mocha on: 12/04/2022 11:19 AM   Modules accepted: Orders

## 2022-12-08 ENCOUNTER — Telehealth: Payer: Medicaid Other | Admitting: Obstetrics & Gynecology

## 2022-12-08 ENCOUNTER — Ambulatory Visit
Admission: RE | Admit: 2022-12-08 | Discharge: 2022-12-08 | Disposition: A | Discharge status: Home / Self Care | Payer: Medicaid Other | Source: Ambulatory Visit | Attending: Family Medicine | Admitting: Family Medicine

## 2022-12-08 DIAGNOSIS — R1032 Left lower quadrant pain: Secondary | ICD-10-CM | POA: Diagnosis not present

## 2022-12-08 DIAGNOSIS — N939 Abnormal uterine and vaginal bleeding, unspecified: Secondary | ICD-10-CM | POA: Insufficient documentation

## 2022-12-09 ENCOUNTER — Other Ambulatory Visit: Payer: Self-pay

## 2022-12-10 LAB — CA 125: Cancer Antigen (CA) 125: 15.4 U/mL (ref 0.0–38.1)

## 2022-12-10 LAB — CANCER ANTIGEN 19-9: CA 19-9: 3 U/mL (ref 0–35)

## 2022-12-19 ENCOUNTER — Telehealth: Payer: Self-pay | Admitting: Family Medicine

## 2022-12-19 DIAGNOSIS — F17200 Nicotine dependence, unspecified, uncomplicated: Secondary | ICD-10-CM

## 2022-12-19 MED ORDER — NICOTINE POLACRILEX 2 MG MT GUM: 2.0000 mg | CHEWING_GUM | OROMUCOSAL | 0 refills | Status: DC | PRN

## 2022-12-19 NOTE — Telephone Encounter (Signed)
Pt req trial of meds for tobacco cessation. Med sent.  F/u in 2-4 weeks  Elwin Mocha, MD

## 2022-12-25 ENCOUNTER — Other Ambulatory Visit: Payer: Self-pay

## 2022-12-30 ENCOUNTER — Encounter: Payer: Self-pay | Admitting: Family Medicine

## 2022-12-30 ENCOUNTER — Ambulatory Visit: Payer: Medicaid Other | Admitting: Family Medicine

## 2022-12-30 ENCOUNTER — Ambulatory Visit
Admission: RE | Admit: 2022-12-30 | Discharge: 2022-12-30 | Disposition: A | Discharge status: Home / Self Care | Payer: Medicaid Other | Source: Ambulatory Visit | Attending: Family Medicine | Admitting: Family Medicine

## 2022-12-30 ENCOUNTER — Ambulatory Visit: Admission: RE | Admit: 2022-12-30 | Payer: Medicaid Other | Source: Ambulatory Visit

## 2022-12-30 VITALS — BP 120/80 | HR 83 | Temp 97.2°F | Ht 61.0 in | Wt 122.0 lb

## 2022-12-30 DIAGNOSIS — G4489 Other headache syndrome: Secondary | ICD-10-CM | POA: Diagnosis not present

## 2022-12-30 DIAGNOSIS — L989 Disorder of the skin and subcutaneous tissue, unspecified: Secondary | ICD-10-CM

## 2022-12-30 DIAGNOSIS — W5503XA Scratched by cat, initial encounter: Secondary | ICD-10-CM

## 2022-12-30 MED ORDER — AZITHROMYCIN 250 MG PO TABS: ORAL_TABLET | ORAL | 0 refills | Status: AC

## 2022-12-30 NOTE — Progress Notes (Unsigned)
   Acute Office Visit  Subjective:     Patient ID: SHAZIA WEHRLY, female    DOB: Dec 30, 1995, 27 y.o.   MRN: [id number]  Chief Complaint  Patient presents with   Abrasion    Pt. C/o cat scratch on nose 4 days ago and now has swollen nodes in neck.    HPI Patient is in today for ***  ROS      Objective:    BP 120/80   Pulse 83   Temp (!) 97.2 F (36.2 C)   Ht 5' 1"$  (1.549 m)   Wt 122 lb (55.3 kg)   SpO2 97%   BMI 23.05 kg/m  {Vitals History (Optional):23777}  Physical Exam  No results found for any visits on 12/30/22.      Assessment & Plan:   Problem List Items Addressed This Visit   None   No orders of the defined types were placed in this encounter.   No follow-ups on file.  Elwin Mocha, MD

## 2023-01-02 DIAGNOSIS — Z419 Encounter for procedure for purposes other than remedying health state, unspecified: Secondary | ICD-10-CM | POA: Diagnosis not present

## 2023-01-09 ENCOUNTER — Encounter: Payer: Self-pay | Admitting: Neurology

## 2023-01-09 ENCOUNTER — Telehealth: Payer: Self-pay | Admitting: Family Medicine

## 2023-01-09 DIAGNOSIS — J329 Chronic sinusitis, unspecified: Secondary | ICD-10-CM

## 2023-01-09 MED ORDER — AMOXICILLIN-POT CLAVULANATE 875-125 MG PO TABS: 1.0000 | ORAL_TABLET | Freq: Two times a day (BID) | ORAL | 0 refills | Status: DC

## 2023-01-09 NOTE — Telephone Encounter (Signed)
Hx of sinusitis. Req abx for flare.  Elwin Mocha, MD

## 2023-01-29 ENCOUNTER — Ambulatory Visit (INDEPENDENT_AMBULATORY_CARE_PROVIDER_SITE_OTHER): Payer: Medicaid Other | Admitting: Dermatology

## 2023-01-29 ENCOUNTER — Encounter: Payer: Self-pay | Admitting: Dermatology

## 2023-01-29 VITALS — BP 116/76 | HR 77

## 2023-01-29 DIAGNOSIS — D2339 Other benign neoplasm of skin of other parts of face: Secondary | ICD-10-CM

## 2023-01-29 DIAGNOSIS — D2330 Other benign neoplasm of skin of unspecified part of face: Secondary | ICD-10-CM

## 2023-01-29 DIAGNOSIS — L719 Rosacea, unspecified: Secondary | ICD-10-CM | POA: Diagnosis not present

## 2023-01-29 NOTE — Progress Notes (Signed)
Follow-Up Visit   Subjective  Joyce Montoya is a 27 y.o. female who presents for the following: Spot at left cheek. Getting larger. Irritated at times. Has been there ~2 years  The patient has spots, moles and lesions to be evaluated, some may be new or changing and the patient has concerns that these could be cancer.  The following portions of the chart were reviewed this encounter and updated as appropriate: medications, allergies, medical history  Review of Systems:  No other skin or systemic complaints except as noted in HPI or Assessment and Plan.  Objective  Well appearing patient in no apparent distress; mood and affect are within normal limits.  A focused examination was performed of the following areas: Face  Relevant physical exam findings are noted in the Assessment and Plan.  Left Cheek  Left Cheek 0.6 cm pink-tan homogenous blanching papule         Assessment & Plan   MELANOCYTIC NEVI B/L cheeks Benign appearing on exam today. Recommend observation. Call clinic for new or changing moles. Recommend daily use of broad spectrum spf 30+ sunscreen to sun-exposed areas.   Exam Tan-light brown symmetric papules  Discussed risk of scarring and recurrence with removal. Defer at this time.   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  Typically we recommend at least 1-3 treatment sessions about 5-8 weeks apart for best results.  Cannot have tanned skin when BBL performed, and regular use of sunscreen is advised after the procedure to help maintain results. The patient's condition may also require "maintenance treatments" in the future.  The fee for BBL / laser treatments is $350 per treatment  session for the whole face.  A fee can be quoted for other parts of the body.  Insurance typically does not pay for BBL/laser treatments and therefore the fee is an out-of-pocket cost.   Benign neoplasm of skin of face Left Cheek  Ddx granuloma faciale vs granulomatous rosacea over sarcoidosis or other. Pt systemically well and without any breathing or swallowing difficulties. No other skin lesions.  Symptomatic, irritating, patient would like treated.  Discussed trial of treatment before biopsy to avoid scar in a cosmetically sensitive area.   treatment options of oral doxycycline, topical corticosteroid, topical tacrolimus, ILK injections discussed  Patient prefers to start with ILK injections  Patient states her whole family had MRSA and was on oral Doxycycline and did not notice improvement with this lesion.   Dr. Nehemiah Massed examined patient and agreed with therapy.  Intralesional injection - Left Cheek Location: left cheek  Informed Consent: Discussed risks (infection, pain, bleeding, bruising, thinning of the skin, loss of skin pigment, lack of resolution, and recurrence of lesion) and benefits of the procedure, as well as the alternatives. Informed consent was obtained. Preparation: The area was prepared a standard fashion.  Anesthesia: none  Procedure Details: An intralesional injection was performed with Kenalog 1 mg/cc. 0.1 cc in total were injected.  Total number of injections: 1  Plan: The patient was instructed on post-op care. Recommend OTC analgesia as needed for pain.  NDC: [id number] Exp: 08/2024 Lot: [id number]   ROSACEA Exam Mid face erythema with telangiectasias   Chronic condition with duration or expected duration over one  year. Currently well-controlled.  Rosacea is a chronic progressive skin condition usually affecting the face of adults, causing redness and/or acne bumps. It is treatable but not curable. It sometimes affects the eyes (ocular rosacea)  as well. It may respond to topical and/or systemic medication and can flare with stress, sun exposure, alcohol, exercise, topical steroids (including hydrocortisone/cortisone 10) and some foods.  Daily application of broad spectrum spf 30+ sunscreen to face is recommended to reduce flares.  Patient denies grittiness of the eyes  Treatment Plan Continue skin medicinals ivermectin- oxymetazoline daily as needed.   Return for BBL Spot  next available With Dr. Jerilynn Mages or Dr. Raliegh Ip. 1 month recheck lesion with Dr. Laurence Ferrari.  I, Emelia Salisbury, CMA, am acting as scribe for Forest Gleason, MD.   Documentation: I have reviewed the above documentation for accuracy and completeness, and I agree with the above.  Forest Gleason, MD

## 2023-01-29 NOTE — Patient Instructions (Signed)
Intralesional steroid injection side effects were reviewed including thinning of the skin and discoloration, such as redness, lightening or darkening.  Due to recent changes in healthcare laws, you may see results of your pathology and/or laboratory studies on MyChart before the doctors have had a chance to review them. We understand that in some cases there may be results that are confusing or concerning to you. Please understand that not all results are received at the same time and often the doctors may need to interpret multiple results in order to provide you with the best plan of care or course of treatment. Therefore, we ask that you please give us 2 business days to thoroughly review all your results before contacting the office for clarification. Should we see a critical lab result, you will be contacted sooner.   If You Need Anything After Your Visit  If you have any questions or concerns for your doctor, please call our main line at [phone] and press option 4 to reach your doctor's medical assistant. If no one answers, please leave a voicemail as directed and we will return your call as soon as possible. Messages left after 4 pm will be answered the following business day.   You may also send us a message via MyChart. We typically respond to MyChart messages within 1-2 business days.  For prescription refills, please ask your pharmacy to contact our office. Our fax number is [phone].  If you have an urgent issue when the clinic is closed that cannot wait until the next business day, you can page your doctor at the number below.    Please note that while we do our best to be available for urgent issues outside of office hours, we are not available 24/7.   If you have an urgent issue and are unable to reach us, you may choose to seek medical care at your doctor's office, retail clinic, urgent care center, or emergency room.  If you have a medical emergency, please immediately  call 911 or go to the emergency department.  Pager Numbers  - Dr. Kowalski: [phone]  - Dr. Moye: [phone]  - Dr. Stewart: [phone]  In the event of inclement weather, please call our main line at [phone] for an update on the status of any delays or closures.  Dermatology Medication Tips: Please keep the boxes that topical medications come in in order to help keep track of the instructions about where and how to use these. Pharmacies typically print the medication instructions only on the boxes and not directly on the medication tubes.   If your medication is too expensive, please contact our office at [phone] option 4 or send us a message through MyChart.   We are unable to tell what your co-pay for medications will be in advance as this is different depending on your insurance coverage. However, we may be able to find a substitute medication at lower cost or fill out paperwork to get insurance to cover a needed medication.   If a prior authorization is required to get your medication covered by your insurance company, please allow us 1-2 business days to complete this process.  Drug prices often vary depending on where the prescription is filled and some pharmacies may offer cheaper prices.  The website www.goodrx.com contains coupons for medications through different pharmacies. The prices here do not account for what the cost may be with help from insurance (it may be cheaper with your insurance), but the website can give you   the price if you did not use any insurance.  - You can print the associated coupon and take it with your prescription to the pharmacy.  - You may also stop by our office during regular business hours and pick up a GoodRx coupon card.  - If you need your prescription sent electronically to a different pharmacy, notify our office through [facility] MyChart or by phone at [phone] option 4.     Si Usted Necesita Algo Despus de Su  Visita  Tambin puede enviarnos un mensaje a travs de MyChart. Por lo general respondemos a los mensajes de MyChart en el transcurso de 1 a 2 das hbiles.  Para renovar recetas, por favor pida a su farmacia que se ponga en contacto con nuestra oficina. Nuestro nmero de fax es el [phone].  Si tiene un asunto urgente cuando la clnica est cerrada y que no puede esperar hasta el siguiente da hbil, puede llamar/localizar a su doctor(a) al nmero que aparece a continuacin.   Por favor, tenga en cuenta que aunque hacemos todo lo posible para estar disponibles para asuntos urgentes fuera del horario de oficina, no estamos disponibles las 24 horas del da, los 7 das de la semana.   Si tiene un problema urgente y no puede comunicarse con nosotros, puede optar por buscar atencin mdica  en el consultorio de su doctor(a), en una clnica privada, en un centro de atencin urgente o en una sala de emergencias.  Si tiene una emergencia mdica, por favor llame inmediatamente al 911 o vaya a la sala de emergencias.  Nmeros de bper  - Dr. Kowalski: [phone]  - Dra. Moye: [phone]  - Dra. Stewart: [phone]  En caso de inclemencias del tiempo, por favor llame a nuestra lnea principal al [phone] para una actualizacin sobre el estado de cualquier retraso o cierre.  Consejos para la medicacin en dermatologa: Por favor, guarde las cajas en las que vienen los medicamentos de uso tpico para ayudarle a seguir las instrucciones sobre dnde y cmo usarlos. Las farmacias generalmente imprimen las instrucciones del medicamento slo en las cajas y no directamente en los tubos del medicamento.   Si su medicamento es muy caro, por favor, pngase en contacto con nuestra oficina llamando al [phone] y presione la opcin 4 o envenos un mensaje a travs de MyChart.   No podemos decirle cul ser su copago por los medicamentos por adelantado ya que esto es diferente dependiendo de la  cobertura de su seguro. Sin embargo, es posible que podamos encontrar un medicamento sustituto a menor costo o llenar un formulario para que el seguro cubra el medicamento que se considera necesario.   Si se requiere una autorizacin previa para que su compaa de seguros cubra su medicamento, por favor permtanos de 1 a 2 das hbiles para completar este proceso.  Los precios de los medicamentos varan con frecuencia dependiendo del lugar de dnde se surte la receta y alguna farmacias pueden ofrecer precios ms baratos.  El sitio web www.goodrx.com tiene cupones para medicamentos de diferentes farmacias. Los precios aqu no tienen en cuenta lo que podra costar con la ayuda del seguro (puede ser ms barato con su seguro), pero el sitio web puede darle el precio si no utiliz ningn seguro.  - Puede imprimir el cupn correspondiente y llevarlo con su receta a la farmacia.  - Tambin puede pasar por nuestra oficina durante el horario de atencin regular y recoger una tarjeta de cupones de GoodRx.  - Si necesita   que su receta se enve electrnicamente a una farmacia diferente, informe a nuestra oficina a travs de MyChart de [facility] o por telfono llamando al [phone] y presione la opcin 4.  

## 2023-02-02 DIAGNOSIS — Z419 Encounter for procedure for purposes other than remedying health state, unspecified: Secondary | ICD-10-CM | POA: Diagnosis not present

## 2023-02-18 ENCOUNTER — Encounter: Payer: Self-pay | Admitting: Family Medicine

## 2023-02-18 ENCOUNTER — Ambulatory Visit: Payer: Medicaid Other | Admitting: Family Medicine

## 2023-02-18 DIAGNOSIS — L237 Allergic contact dermatitis due to plants, except food: Secondary | ICD-10-CM

## 2023-02-18 MED ORDER — PREDNISONE 10 MG (21) PO TBPK: ORAL_TABLET | ORAL | 0 refills | Status: DC

## 2023-02-18 NOTE — Progress Notes (Signed)
    Virtual telephone visit      Virtual Visit via Telephone Note  This format is felt to be most appropriate for this patient at this time. The patient did not have access to video technology or had technical difficulties with video requiring transitioning to audio format only (telephone). Physical exam was limited to content and character of the telephone converstion.    Patient location: Home Provider location: Office    Patient: Joyce Montoya   DOB: 1996-05-10   27 y.o. Female  MRN: [id number] Visit Date: 02/18/2023  Today's Provider: Haydee Salter, MD  Subjective:   No chief complaint on file.  CC: rash  27 year old female past medical history significant for recurrent migraines presents with chief complaint of rash all over her body.  Patient works outside and sometimes uses the bathroom in the woods.  She reports being exposed to poison ivy and now have an itchy rash all over her body.  The rash is spread is in a regular fashion.  No issues with breathing.  Patient requesting treatment.         Medications: Outpatient Medications Prior to Visit  Medication Sig   amoxicillin-clavulanate (AUGMENTIN) 875-125 MG tablet Take 1 tablet by mouth 2 (two) times daily.   ketorolac (TORADOL) 10 MG tablet Take 1 tablet (10 mg total) by mouth every 6 (six) hours as needed.   nicotine polacrilex (NICORETTE) 2 MG gum Take 1 each (2 mg total) by mouth as needed for smoking cessation.   norethindrone-ethinyl estradiol-FE ([name] 1/20) 1-20 MG-MCG tablet Take 1 tablet by mouth daily.   tretinoin (RETIN-A) 0.025 % cream Apply pea-sized amount to face at bedtime, wash off in morning.   No facility-administered medications prior to visit.    Review of Systems  All other systems reviewed and are negative.       Objective:    There were no vitals taken for this visit.   Speaking in full sentences.  No audible wheeze.  Alert and oriented x 3 No slurred speech.  Normal work of  breathing.      Assessment & Plan:    Poison ivy - Patient advised to launder bedding - Repackaged prednisone taper sent to patient's pharmacy - Patient advised to call back if not resolved - Bathing advised - Discussed differential and warning signs that would point to this not being poison ivy, poison voiced understanding    I discussed the assessment and treatment plan with the patient. The patient was provided an opportunity to ask questions and all were answered. The patient agreed with the plan and demonstrated an understanding of the instructions.   The patient was advised to call back or seek an in-person evaluation if the symptoms worsen or if the condition fails to improve as anticipated.  I provided 13 minutes of non-face-to-face time during this encounter.   Haydee Salter, MD  [facility] [phone] (phone) [phone] (fax)

## 2023-02-26 ENCOUNTER — Ambulatory Visit: Payer: Medicaid Other | Admitting: Dermatology

## 2023-02-27 NOTE — Progress Notes (Unsigned)
NEUROLOGY CONSULTATION NOTE  Joyce Montoya MRN: [id number] DOB: [birth year]  Referring provider: Eliezer Bottom, MD Primary care provider: Eliezer Bottom, MD  Reason for consult:  migraine  Assessment/Plan:   Chronic migraine without aura, without status migrainosus, not intractable Partial empty sella, typically normal variant but may signify increased intracranial pressure     She will get a formal eye exam to evaluate for papilledema and have results sent to me. Regardless of eye exam, will then schedule for lumbar puncture to assess opening pressure.  If elevated, start acetazolamide.  If normal, would likely start a CGRP inhibitor. Migraine rescue:  She will try samples of Ubrelvy 100mg . Limit use of pain relievers to no more than 2 days out of week to prevent risk of rebound or medication-overuse headache. Keep headache diary Follow up 6 months.    Subjective:  Joyce Montoya is a 27 year old female who presents for migraines.  History supplemented by referring provider's note.  MRI of brain from 12/2020 and 12/2022 personally reviewed.  Onset:  2022.  Started shortly after tonsillectomy  Location:  varies - sometimes front left or right, top of head, temples, rarely back of head Quality:  pressure, throbbing Intensity:  8/10.   Aura:  absent Prodrome:  feels tired, sluggish to day before Associated symptoms:  blurred vision, nausea, photophobia, phonophobia, brief vertigo with quick head movements.  One time, she was lightheaded and felt like she was going to pass out.  She denies associated unilateral numbness or weakness. Duration:  1 day (sometimes 2 days) Frequency:  once every 3 days Frequency of abortive medication: usually doesn't take anything Triggers:  menstrual cycle.  Worse when laying down. Relieving factors:  ice packs, hot water Activity:  aggravates  MRI of brain without contrast on 12/30/2022 personally reviewed revealed partially empty sella (as  previously seen on prior MRI from 12/16/2020) and mild paranasal sinus mucosal thickening but otherwise unremarkable. Junel was discontinued and switched to another bill but she doesn't remember the name.  Denies visual obscurations and pulsatile tinnitus..    Past NSAIDS/analgesics:  Fioricet, acetaminophen, acetaminophen with codeine, ketorolac Past abortive triptans:  sumatriptan tab, rizatriptan (made head burn) Past abortive ergotamine:  none Past muscle relaxants:  none Past anti-emetic:  Zofran, Phenrgan Past antihypertensive medications:  propranolol Past antidepressant medications:  nortriptyline Past anticonvulsant medications:  topiramate Past anti-CGRP:  none Past vitamins/Herbal/Supplements:  none Past antihistamines/decongestants:  none Other past therapies:  none  Current NSAIDS/analgesics:  Tylenol, ibuprofen, Goody Current triptans:  none Current ergotamine:  none Current anti-emetic:  none Current muscle relaxants:  none Current Antihypertensive medications:  none Current Antidepressant medications:  none Current Anticonvulsant medications:  none Current anti-CGRP:  none Current Vitamins/Herbal/Supplements:  none Current Antihistamines/Decongestants:  none Other therapy:  steroid shots Birth control:  [name]   Caffeine:  1 cup coffee daily.  No soda Diet:  less than 60 oz water daily.  Does not skip meals. Exercise:  rides horses daily, works outside for [employer]. Depression:  no; Anxiety:  "always" Other pain:  no Sleep hygiene:  wakes up frequently, tosses and turns.  8 hours of broken sleep Family history of headache:  no      PAST MEDICAL HISTORY: Past Medical History:  Diagnosis Date   Frequent headaches    History of chlamydia    Irregular menses    SAB (spontaneous abortion) 11/2016    PAST SURGICAL HISTORY: Past Surgical History:  Procedure Laterality Date  HAND SURGERY Right 06/2016   TONSILLECTOMY AND ADENOIDECTOMY  Bilateral 08/03/2020   Procedure: TONSILLECTOMY AND possible ADENOIDECTOMY;  Surgeon: Linus Salmons, MD;  Location: [facility];  Service: ENT;  Laterality: Bilateral;   TONSILLECTOMY AND ADENOIDECTOMY N/A 10/12/2020   Procedure: TONSILLECTOMY;  Surgeon: Linus Salmons, MD;  Location: [facility];  Service: ENT;  Laterality: N/A;   WISDOM TOOTH EXTRACTION      MEDICATIONS: Current Outpatient Medications on File Prior to Visit  Medication Sig Dispense Refill   amoxicillin-clavulanate (AUGMENTIN) 875-125 MG tablet Take 1 tablet by mouth 2 (two) times daily. 20 tablet 0   ketorolac (TORADOL) 10 MG tablet Take 1 tablet (10 mg total) by mouth every 6 (six) hours as needed. 20 tablet 0   nicotine polacrilex (NICORETTE) 2 MG gum Take 1 each (2 mg total) by mouth as needed for smoking cessation. 100 tablet 0   norethindrone-ethinyl estradiol-FE ([name] 1/20) 1-20 MG-MCG tablet Take 1 tablet by mouth daily. 84 tablet 3   predniSONE (STERAPRED UNI-PAK 21 TAB) 10 MG (21) TBPK tablet Use as directed 1 each 0   tretinoin (RETIN-A) 0.025 % cream Apply pea-sized amount to face at bedtime, wash off in morning. 45 g 3   No current facility-administered medications on file prior to visit.    ALLERGIES: Allergies  Allergen Reactions   Sulfa Antibiotics     FAMILY HISTORY: Family History  Problem Relation Age of Onset   Headache Mother    Ovarian cysts Mother    Headache Sister     Objective:  Blood pressure 103/65, pulse 80, height 5\' 1"  (1.549 m), weight 124 lb 3.2 oz (56.3 kg), SpO2 91 %. General: No acute distress.  Patient appears well-groomed.   Head:  Normocephalic/atraumatic Eyes:  fundi examined but not visualized Neck: supple, no paraspinal tenderness, full range of motion Back: No paraspinal tenderness Heart: regular rate and rhythm Lungs: Clear to auscultation bilaterally. Vascular: No carotid bruits. Neurological Exam: Mental status: alert and oriented to  person, place, and time, speech fluent and not dysarthric, language intact. Cranial nerves: CN I: not tested CN II: pupils equal, round and reactive to light, visual fields intact CN III, IV, VI:  full range of motion, no nystagmus, no ptosis CN V: facial sensation intact. CN VII: upper and lower face symmetric CN VIII: hearing intact CN IX, X: gag intact, uvula midline CN XI: sternocleidomastoid and trapezius muscles intact CN XII: tongue midline Bulk & Tone: normal, no fasciculations. Motor:  muscle strength 5/5 throughout Sensation:  Pinprick, temperature and vibratory sensation intact. Deep Tendon Reflexes:  2+ throughout,  toes downgoing.   Finger to nose testing:  Without dysmetria.   Heel to shin:  Without dysmetria.   Gait:  Normal station and stride.  Romberg negative.    Thank you for allowing me to take part in the care of this patient.  Shon Millet, DO  CC: Eliezer Bottom, MD

## 2023-03-02 ENCOUNTER — Encounter: Payer: Self-pay | Admitting: Neurology

## 2023-03-02 ENCOUNTER — Ambulatory Visit (INDEPENDENT_AMBULATORY_CARE_PROVIDER_SITE_OTHER): Payer: Medicaid Other | Admitting: Neurology

## 2023-03-02 VITALS — BP 103/65 | HR 80 | Ht 61.0 in | Wt 124.2 lb

## 2023-03-02 DIAGNOSIS — E236 Other disorders of pituitary gland: Secondary | ICD-10-CM | POA: Diagnosis not present

## 2023-03-02 DIAGNOSIS — G43709 Chronic migraine without aura, not intractable, without status migrainosus: Secondary | ICD-10-CM

## 2023-03-02 NOTE — Patient Instructions (Signed)
Get an eye exam - have them evaluate for papilledema.  Have them send their note to me.  Also, let me know once you have seen the eye doctor. After eye exam, plan is to schedule for spinal tap Take Ubrelvy earliest onset of headache.  May repeat after 2 hours.  Maximum 2 tablets in 24 hours. Limit use of pain relievers to no more than 2 days out of week to prevent risk of rebound or medication-overuse headache. Keep headache diary Consider magnesium citrate 400mg  daily, riboflavin 400mg  daily, CoQ10 300mg  daily Follow up 6 months.

## 2023-03-03 ENCOUNTER — Ambulatory Visit: Payer: Medicaid Other | Admitting: Dermatology

## 2023-03-03 ENCOUNTER — Encounter: Payer: Self-pay | Admitting: Dermatology

## 2023-03-03 DIAGNOSIS — D369 Benign neoplasm, unspecified site: Secondary | ICD-10-CM | POA: Diagnosis not present

## 2023-03-03 DIAGNOSIS — D1801 Hemangioma of skin and subcutaneous tissue: Secondary | ICD-10-CM

## 2023-03-03 MED ORDER — TACROLIMUS 0.1 % EX OINT: TOPICAL_OINTMENT | Freq: Two times a day (BID) | CUTANEOUS | 1 refills | Status: AC

## 2023-03-03 NOTE — Patient Instructions (Signed)
Your prescription was sent to [facility] in [place]. A representative from [employer] will contact you within 2 business hours to verify your address and insurance information to schedule a free delivery. If for any reason you do not receive a phone call from them, please reach out to them. Their phone number is [phone] and their hours are Monday-Friday 9:00 am-5:00 pm.     Due to recent changes in healthcare laws, you may see results of your pathology and/or laboratory studies on MyChart before the doctors have had a chance to review them. We understand that in some cases there may be results that are confusing or concerning to you. Please understand that not all results are received at the same time and often the doctors may need to interpret multiple results in order to provide you with the best plan of care or course of treatment. Therefore, we ask that you please give [place] 2 business days to thoroughly review all your results before contacting the office for clarification. Should we see a critical lab result, you will be contacted sooner.   If You Need Anything After Your Visit  If you have any questions or concerns for your doctor, please call our main line at [phone] and press option 4 to reach your doctor's medical assistant. If no one answers, please leave a voicemail as directed and we will return your call as soon as possible. Messages left after 4 pm will be answered the following business day.   You may also send [place] a message via MyChart. We typically respond to MyChart messages within 1-2 business days.  For prescription refills, please ask your pharmacy to contact our office. Our fax number is [phone].  If you have an urgent issue when the clinic is closed that cannot wait until the next business day, you can page your doctor at the number below.    Please note that while we do our best to be available for urgent issues outside of office hours, we are not  available 24/7.   If you have an urgent issue and are unable to reach [place], you may choose to seek medical care at your doctor's office, retail clinic, urgent care center, or emergency room.  If you have a medical emergency, please immediately call 911 or go to the emergency department.  Pager Numbers  - Dr. Gwen Pounds: [phone]  - Dr. Neale Burly: [phone]  - Dr. Roseanne Reno: [phone]  In the event of inclement weather, please call our main line at [phone] for an update on the status of any delays or closures.  Dermatology Medication Tips: Please keep the boxes that topical medications come in in order to help keep track of the instructions about where and how to use these. Pharmacies typically print the medication instructions only on the boxes and not directly on the medication tubes.   If your medication is too expensive, please contact our office at [phone] option 4 or send [place] a message through MyChart.   We are unable to tell what your co-pay for medications will be in advance as this is different depending on your insurance coverage. However, we may be able to find a substitute medication at lower cost or fill out paperwork to get insurance to cover a needed medication.   If a prior authorization is required to get your medication covered by your insurance company, please allow [place] 1-2 business days to complete this process.  Drug prices often vary depending on where the prescription is filled  and some pharmacies may offer cheaper prices.  The website www.goodrx.com contains coupons for medications through different pharmacies. The prices here do not account for what the cost may be with help from insurance (it may be cheaper with your insurance), but the website can give you the price if you did not use any insurance.  - You can print the associated coupon and take it with your prescription to the pharmacy.  - You may also stop by our office during regular business hours  and pick up a GoodRx coupon card.  - If you need your prescription sent electronically to a different pharmacy, notify our office through [facility] or by phone at [phone] option 4.

## 2023-03-03 NOTE — Progress Notes (Addendum)
   Follow-Up Visit   Subjective  Joyce Montoya is a 27 y.o. 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 Patient feels it may be lighter in color.   The following portions of the chart were reviewed this encounter and updated as appropriate: medications, allergies, medical history  Review of Systems:  No other skin or systemic complaints except as noted in HPI or Assessment and Plan.  Objective  Well appearing patient in no apparent distress; mood and affect are within normal limits.   A focused examination was performed of the following areas: face  Relevant exam findings are noted in the Assessment and Plan.  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 Pt systemically well and without any breathing or swallowing difficulties. No other skin lesions. Improved since treatment with ILK. Will continue to monitor. She will call for worsening or new lesions.  Start tacrolimus twice daily to affected area at left cheek.  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 Counseled on photoprotection. Reviewed $200 cosmetic BBL spot charge.   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  Anise Salvo, RMA, am acting as scribe for Darden Dates, MD .   Documentation: I have reviewed the above documentation for accuracy and completeness, and I agree with the above.  Darden Dates, MD

## 2023-03-04 DIAGNOSIS — Z419 Encounter for procedure for purposes other than remedying health state, unspecified: Secondary | ICD-10-CM | POA: Diagnosis not present

## 2023-03-09 ENCOUNTER — Other Ambulatory Visit: Payer: Self-pay | Admitting: Neurology

## 2023-03-09 ENCOUNTER — Encounter: Payer: Self-pay | Admitting: Neurology

## 2023-03-10 ENCOUNTER — Other Ambulatory Visit: Payer: Self-pay

## 2023-03-10 DIAGNOSIS — G932 Benign intracranial hypertension: Secondary | ICD-10-CM

## 2023-03-10 NOTE — Progress Notes (Signed)
Per Dr.Jaffe, I am glad that the Bernita Raisin is helping.  I would like to send in a prescription.  Please verify pharmacy and send in prescription for Ubrelvy 100mg  as needed.  May repeat after 2 hours.  Maximum 2 tablets in 24 hours.  Quantity 10, Refills 11.  As discussed, we will proceed with spinal tap despite normal eye exam.  Please send in order (evaluate for idiopathic intracranial hypertension).  Check CSF cell count, protein, glucose, gram stain and culture, cytology.

## 2023-03-11 ENCOUNTER — Other Ambulatory Visit: Payer: Self-pay

## 2023-03-11 MED ORDER — UBRELVY 100 MG PO TABS: 100.0000 mg | ORAL_TABLET | ORAL | 5 refills | Status: AC | PRN

## 2023-03-12 ENCOUNTER — Telehealth: Payer: Self-pay | Admitting: Neurology

## 2023-03-12 DIAGNOSIS — N907 Vulvar cyst: Secondary | ICD-10-CM | POA: Diagnosis not present

## 2023-03-12 DIAGNOSIS — L739 Follicular disorder, unspecified: Secondary | ICD-10-CM | POA: Diagnosis not present

## 2023-03-12 NOTE — Telephone Encounter (Signed)
Patient needs a call to discuss her Ubrelvy rx. She went to pick it up but the pharmacy said she needs to contact us about approval. The patient wants to know if she can pay cash for it, instead of waiting on the approval

## 2023-03-13 NOTE — Telephone Encounter (Signed)
Advised patient try with a coay card it should allow for at least one month free until PA can be done. If not give [place] a call or send a mychart message to let us know and we can ask Dr.Jaffe for samples.      PA team if you could start a PA for Ubrelvy 100 mg

## 2023-03-25 NOTE — Telephone Encounter (Deleted)
Error

## 2023-04-01 ENCOUNTER — Telehealth: Payer: Self-pay | Admitting: Pharmacy Technician

## 2023-04-01 ENCOUNTER — Other Ambulatory Visit (HOSPITAL_COMMUNITY): Payer: Self-pay

## 2023-04-01 NOTE — Telephone Encounter (Signed)
Status of PA in separate encounter 

## 2023-04-01 NOTE — Telephone Encounter (Signed)
Patient Advocate Encounter   Received notification that prior authorization for Ubrelvy 100MG  tablets is required.   PA submitted on 04/01/2023 Key [id number] Insurance [facility] Medicaid of [facility] Electronic Prior Authorization Request Form Status is pending       Roland Earl, CPhT Pharmacy Patient Advocate Specialist [facility] Patient Advocate Team Direct Number: [phone]  Fax: [phone]

## 2023-04-01 NOTE — Telephone Encounter (Signed)
Patient Advocate Encounter  Prior Authorization for UBRELVY 100MG  has been approved with [facility].    PA# [id number] Effective dates: 5.15.24 through 5.29.25  Per [facility] test claim, copay for 30 days supply is $4

## 2023-04-03 ENCOUNTER — Ambulatory Visit
Admission: RE | Admit: 2023-04-03 | Discharge: 2023-04-03 | Disposition: A | Discharge status: Home / Self Care | Payer: Medicaid Other | Source: Ambulatory Visit | Attending: Neurology | Admitting: Neurology

## 2023-04-03 ENCOUNTER — Other Ambulatory Visit (HOSPITAL_COMMUNITY)
Admission: RE | Admit: 2023-04-03 | Discharge: 2023-04-03 | Disposition: A | Discharge status: Home / Self Care | Payer: Medicaid Other | Source: Ambulatory Visit | Attending: Neurology | Admitting: Neurology

## 2023-04-03 VITALS — BP 107/73 | HR 68

## 2023-04-03 DIAGNOSIS — G932 Benign intracranial hypertension: Secondary | ICD-10-CM

## 2023-04-03 DIAGNOSIS — R836 Abnormal cytological findings in cerebrospinal fluid: Secondary | ICD-10-CM | POA: Diagnosis not present

## 2023-04-03 DIAGNOSIS — R519 Headache, unspecified: Secondary | ICD-10-CM | POA: Insufficient documentation

## 2023-04-03 NOTE — Discharge Instructions (Signed)

## 2023-04-03 NOTE — Progress Notes (Signed)
LMOVM for patient to call the office back.

## 2023-04-04 DIAGNOSIS — Z419 Encounter for procedure for purposes other than remedying health state, unspecified: Secondary | ICD-10-CM | POA: Diagnosis not present

## 2023-04-06 ENCOUNTER — Telehealth: Payer: Self-pay | Admitting: Neurology

## 2023-04-06 DIAGNOSIS — G43709 Chronic migraine without aura, not intractable, without status migrainosus: Secondary | ICD-10-CM

## 2023-04-06 LAB — CYTOLOGY - NON PAP

## 2023-04-06 NOTE — Telephone Encounter (Signed)
Pt called in wanting her spinal tap results

## 2023-04-07 LAB — CSF CULTURE W GRAM STAIN
MICRO NUMBER:: 15025504
Result:: NO GROWTH
SPECIMEN QUALITY:: ADEQUATE

## 2023-04-07 LAB — CSF CELL COUNT WITH DIFFERENTIAL
RBC Count, CSF: 0 cells/uL
TOTAL NUCLEATED CELL: 1 cells/uL (ref 0–5)

## 2023-04-07 LAB — GLUCOSE, CSF: Glucose, CSF: 48 mg/dL (ref 40–80)

## 2023-04-07 LAB — PROTEIN, CSF: Total Protein, CSF: 24 mg/dL (ref 15–45)

## 2023-04-09 MED ORDER — AIMOVIG 140 MG/ML ~~LOC~~ SOAJ: 140.0000 mg | SUBCUTANEOUS | 11 refills | Status: AC

## 2023-04-09 NOTE — Telephone Encounter (Signed)
Per Dr.Jaffe, Pressure is normal, so I think these are just frequent migraines.  As discussed, I would like to start a migraine preventative, Aimovig 140mg  injection every 28 days.  If she is agreeable, please send in prescription with 11 additional refills.   Patient agrees and Aimovig 140 mg

## 2023-05-04 DIAGNOSIS — Z419 Encounter for procedure for purposes other than remedying health state, unspecified: Secondary | ICD-10-CM | POA: Diagnosis not present

## 2023-05-05 ENCOUNTER — Telehealth: Payer: Self-pay | Admitting: Neurology

## 2023-05-05 NOTE — Telephone Encounter (Signed)
Patient said her ins denied Aimovig. Wants to know about getting a form filled out for a discount.

## 2023-05-06 ENCOUNTER — Other Ambulatory Visit (HOSPITAL_COMMUNITY): Payer: Self-pay

## 2023-05-06 ENCOUNTER — Encounter: Payer: Self-pay | Admitting: Neurology

## 2023-05-06 ENCOUNTER — Telehealth: Payer: Self-pay | Admitting: Pharmacy Technician

## 2023-05-06 NOTE — Telephone Encounter (Signed)
Pharmacy Patient Advocate Encounter   Received notification from Pt Calls Messages that prior authorization for Aimovig 140MG /ML auto-injectors is required/requested.   Insurance verification completed.   The patient is insured through [facility] [place] .   Per test claim:  PA submitted to [facility] Medicaid via CoverMyMeds Key/confirmation #/EOC [id number] Status is pending

## 2023-05-06 NOTE — Telephone Encounter (Signed)
Advised patient we can start a Prior Auth.   PA team please start a PA.

## 2023-05-06 NOTE — Telephone Encounter (Signed)
PA request has been Submitted. New Encounter created for follow up. For additional info see Pharmacy Prior Auth telephone encounter from 05/06/2023.

## 2023-05-12 NOTE — Telephone Encounter (Signed)
Pharmacy Patient Advocate Encounter  Received notification from [facility] Medicaid that Prior Authorization for Aimovig 140MG /ML auto-injectors  has been APPROVED from 05/06/2023 to 08/04/2023.Marland Kitchen  PA #/Case ID/Reference #: [id number]

## 2023-05-14 ENCOUNTER — Telehealth: Payer: Self-pay

## 2023-05-14 NOTE — Telephone Encounter (Signed)
Chart review completed for patient. Patient is due for cervical cancer screen. Mychart message sent to patient to inquire about scheduling.  Wrigley Winborne, Population Health Specialist.   

## 2023-05-18 ENCOUNTER — Telehealth: Payer: Self-pay | Admitting: Neurology

## 2023-05-18 MED ORDER — PREDNISONE 10 MG (21) PO TBPK: ORAL_TABLET | ORAL | 0 refills | Status: AC

## 2023-05-18 NOTE — Telephone Encounter (Signed)
Pt is calling in stating that she is having 8 days of a headache after taking the Erenumab-aooe (AIMOVIG) 140 MG and she is having the headache none stop even when she goes to sleep and wakes up the headache that has not eased up. She is not if she did something wrong when she gave herself the shot.  Pt also wanted to see if the Ubrogepant (UBRELVY) 100 MG was refilled.  Pt would like to have a call back.

## 2023-05-18 NOTE — Telephone Encounter (Signed)
Per Patient had her 2 nd dose of Aimovig on Friday 7/5, Two days later migraine start and she had it ever since. She hasn't taken anything for it at all.  Advised patient that the Bernita Raisin is to be taken as need at the first sign of a migraine and it is approved and at her Pharmacy. Copay $4. Please call the pharmacy to get it filled. I can Ask Dr,jaffe to see if we can send in a prednisone taper for the migraine she had for the last week.  If she is unable to pick up Ubrelvy please send me a mychart message or give [place] a cal.     Please advise.

## 2023-05-18 NOTE — Telephone Encounter (Signed)
Patient advised of Dr.jaffe note, Send in the prednisone taper to break current intractable migraine.    Script sent.

## 2023-06-04 DIAGNOSIS — Z419 Encounter for procedure for purposes other than remedying health state, unspecified: Secondary | ICD-10-CM | POA: Diagnosis not present

## 2023-06-10 ENCOUNTER — Ambulatory Visit: Payer: Medicaid Other | Admitting: Dermatology

## 2023-06-29 DIAGNOSIS — W57XXXA Bitten or stung by nonvenomous insect and other nonvenomous arthropods, initial encounter: Secondary | ICD-10-CM | POA: Diagnosis not present

## 2023-06-29 DIAGNOSIS — L03115 Cellulitis of right lower limb: Secondary | ICD-10-CM | POA: Diagnosis not present

## 2023-06-29 DIAGNOSIS — L0390 Cellulitis, unspecified: Secondary | ICD-10-CM | POA: Diagnosis not present

## 2023-06-29 DIAGNOSIS — Z20822 Contact with and (suspected) exposure to covid-19: Secondary | ICD-10-CM | POA: Diagnosis not present

## 2023-06-29 DIAGNOSIS — Z882 Allergy status to sulfonamides status: Secondary | ICD-10-CM | POA: Diagnosis not present

## 2023-06-29 DIAGNOSIS — R000 Tachycardia, unspecified: Secondary | ICD-10-CM | POA: Diagnosis not present

## 2023-06-29 DIAGNOSIS — S80861A Insect bite (nonvenomous), right lower leg, initial encounter: Secondary | ICD-10-CM | POA: Diagnosis not present

## 2023-06-29 DIAGNOSIS — Z8614 Personal history of Methicillin resistant Staphylococcus aureus infection: Secondary | ICD-10-CM | POA: Diagnosis not present

## 2023-06-29 DIAGNOSIS — M7989 Other specified soft tissue disorders: Secondary | ICD-10-CM | POA: Diagnosis not present

## 2023-06-30 DIAGNOSIS — R000 Tachycardia, unspecified: Secondary | ICD-10-CM | POA: Diagnosis not present

## 2023-07-05 DIAGNOSIS — Z419 Encounter for procedure for purposes other than remedying health state, unspecified: Secondary | ICD-10-CM | POA: Diagnosis not present

## 2023-08-04 DIAGNOSIS — Z419 Encounter for procedure for purposes other than remedying health state, unspecified: Secondary | ICD-10-CM | POA: Diagnosis not present

## 2023-09-04 DIAGNOSIS — Z419 Encounter for procedure for purposes other than remedying health state, unspecified: Secondary | ICD-10-CM | POA: Diagnosis not present

## 2023-10-04 DIAGNOSIS — Z419 Encounter for procedure for purposes other than remedying health state, unspecified: Secondary | ICD-10-CM | POA: Diagnosis not present

## 2023-11-04 DIAGNOSIS — Z419 Encounter for procedure for purposes other than remedying health state, unspecified: Secondary | ICD-10-CM | POA: Diagnosis not present

## 2023-12-05 DIAGNOSIS — Z419 Encounter for procedure for purposes other than remedying health state, unspecified: Secondary | ICD-10-CM | POA: Diagnosis not present

## 2024-01-02 DIAGNOSIS — Z419 Encounter for procedure for purposes other than remedying health state, unspecified: Secondary | ICD-10-CM | POA: Diagnosis not present

## 2024-02-13 DIAGNOSIS — Z419 Encounter for procedure for purposes other than remedying health state, unspecified: Secondary | ICD-10-CM | POA: Diagnosis not present

## 2024-03-14 DIAGNOSIS — Z419 Encounter for procedure for purposes other than remedying health state, unspecified: Secondary | ICD-10-CM | POA: Diagnosis not present

## 2024-03-21 DIAGNOSIS — Z882 Allergy status to sulfonamides status: Secondary | ICD-10-CM | POA: Diagnosis not present

## 2024-03-21 DIAGNOSIS — G43909 Migraine, unspecified, not intractable, without status migrainosus: Secondary | ICD-10-CM | POA: Diagnosis not present

## 2024-03-21 DIAGNOSIS — N939 Abnormal uterine and vaginal bleeding, unspecified: Secondary | ICD-10-CM | POA: Diagnosis not present

## 2024-03-24 ENCOUNTER — Telehealth: Payer: Self-pay

## 2024-03-24 NOTE — Telephone Encounter (Signed)
 Pharmacy Patient Advocate Encounter   Received notification from CoverMyMeds that prior authorization for Ubrelvy  100MG  tablets is required/requested.   Insurance verification completed.   The patient is insured through [facility] [place] .   Per test claim: Patient has not been seen in a year and needs updated office visit documentation.

## 2024-04-14 DIAGNOSIS — Z419 Encounter for procedure for purposes other than remedying health state, unspecified: Secondary | ICD-10-CM | POA: Diagnosis not present

## 2024-04-18 ENCOUNTER — Encounter: Payer: Self-pay | Admitting: Dermatology

## 2024-04-18 ENCOUNTER — Ambulatory Visit: Admitting: Dermatology

## 2024-04-18 DIAGNOSIS — D229 Melanocytic nevi, unspecified: Secondary | ICD-10-CM

## 2024-04-18 DIAGNOSIS — D239 Other benign neoplasm of skin, unspecified: Secondary | ICD-10-CM

## 2024-04-18 DIAGNOSIS — D225 Melanocytic nevi of trunk: Secondary | ICD-10-CM | POA: Diagnosis not present

## 2024-04-18 DIAGNOSIS — D492 Neoplasm of unspecified behavior of bone, soft tissue, and skin: Secondary | ICD-10-CM | POA: Diagnosis not present

## 2024-04-18 DIAGNOSIS — D2239 Melanocytic nevi of other parts of face: Secondary | ICD-10-CM

## 2024-04-18 HISTORY — DX: Other benign neoplasm of skin, unspecified: D23.9

## 2024-04-18 NOTE — Progress Notes (Signed)
   Follow-Up Visit   Subjective  Joyce Montoya is a 28 y.o. female who presents for the following: Mole on chest. Gets larger every year. No changes with color.  Moles on face. Asymptomatic. Would like to discuss removal. Bothersome being on her face.   The patient has spots, moles and lesions to be evaluated, some may be new or changing and the patient may have concern these could be cancer.    The following portions of the chart were reviewed this encounter and updated as appropriate: medications, allergies, medical history  Review of Systems:  No other skin or systemic complaints except as noted in HPI or Assessment and Plan.  Objective  Well appearing patient in no apparent distress; mood and affect are within normal limits.  A focused examination was performed of the following areas: Face and chest   Relevant physical exam findings are noted in the Assessment and Plan.  Left chest 4 mm brown macule          Assessment & Plan   MELANOCYTIC NEVI Exam: Benign pink papules with focal pigment on bilateral perinasal skin. Benign compound vs dermal nevi  Treatment Plan: Benign appearing on exam today. Recommend observation.  Discussed risk of scarring with shave removal, risk of recurrence. Can leave darker or lighter mark on face with removal.  Discussed shave removal vs excision with plastic surgeon.  Plan: discussed we could shave raised portion but base will have nevus and it can regrow. Will create circular scar. True removal requires excision and will result in linear scar. Scar may be more noticeable than lesion. Patient opts for excision with plastic surgery.  Will send referral to plastic surgery.  MULTIPLE NEVI    Related Procedures Ambulatory referral to Plastic Surgery NEOPLASM OF SKIN Left chest Epidermal / dermal shaving  Lesion diameter (cm):  0.4 Informed consent: discussed and consent obtained   Timeout: patient name, date of birth, surgical  site, and procedure verified   Procedure prep:  Patient was prepped and draped in usual sterile fashion Prep type:  Povidone-iodine and isopropyl alcohol Anesthesia: the lesion was anesthetized in a standard fashion   Anesthetic:  1% lidocaine  w/ epinephrine  1-100,000 buffered w/ 8.4% NaHCO3 Instrument used: DermaBlade   Hemostasis achieved with: pressure and aluminum chloride   Outcome: patient tolerated procedure well   Post-procedure details: wound care instructions given   Specimen 1 - Surgical pathology Differential Diagnosis: Dysplastic nevus vs Melanoma  Check Margins: Yes   Return if symptoms worsen or fail to improve.  I, Jill Parcell, CMA, am acting as scribe for Harris Liming, MD.   Documentation: I have reviewed the above documentation for accuracy and completeness, and I agree with the above.  Harris Liming, MD

## 2024-04-18 NOTE — Patient Instructions (Addendum)
 Recommend daily broad spectrum sunscreen SPF 30+ to sun-exposed areas, reapply every 2 hours as needed. Call for new or changing lesions.  Staying in the shade or wearing long sleeves, sun glasses (UVA+UVB protection) and wide brim hats (4-inch brim around the entire circumference of the hat) are also recommended for sun protection.     Wound Care Instructions  Cleanse wound gently with soap and water once a day then pat dry with clean gauze. Apply a thin coat of Petrolatum (petroleum jelly, "Vaseline") over the wound (unless you have an allergy to this). We recommend that you use a new, sterile tube of Vaseline. Do not pick or remove scabs. Do not remove the yellow or white "healing tissue" from the base of the wound.  Cover the wound with fresh, clean, nonstick gauze and secure with paper tape. You may use Band-Aids in place of gauze and tape if the wound is small enough, but would recommend trimming much of the tape off as there is often too much. Sometimes Band-Aids can irritate the skin.  You should call the office for your biopsy report after 1 week if you have not already been contacted.  If you experience any problems, such as abnormal amounts of bleeding, swelling, significant bruising, significant pain, or evidence of infection, please call the office immediately.  FOR ADULT SURGERY PATIENTS: If you need something for pain relief you may take 1 extra strength Tylenol (acetaminophen) AND 2 Ibuprofen (200mg  each) together every 4 hours as needed for pain. (do not take these if you are allergic to them or if you have a reason you should not take them.) Typically, you may only need pain medication for 1 to 3 days.       Due to recent changes in healthcare laws, you may see results of your pathology and/or laboratory studies on MyChart before the doctors have had a chance to review them. We understand that in some cases there may be results that are confusing or concerning to you. Please  understand that not all results are received at the same time and often the doctors may need to interpret multiple results in order to provide you with the best plan of care or course of treatment. Therefore, we ask that you please give us  2 business days to thoroughly review all your results before contacting the office for clarification. Should we see a critical lab result, you will be contacted sooner.   If You Need Anything After Your Visit  If you have any questions or concerns for your doctor, please call our main line at [phone] and press option 4 to reach your doctor's medical assistant. If no one answers, please leave a voicemail as directed and we will return your call as soon as possible. Messages left after 4 pm will be answered the following business day.   You may also send us  a message via MyChart. We typically respond to MyChart messages within 1-2 business days.  For prescription refills, please ask your pharmacy to contact our office. Our fax number is [phone].  If you have an urgent issue when the clinic is closed that cannot wait until the next business day, you can page your doctor at the number below.    Please note that while we do our best to be available for urgent issues outside of office hours, we are not available 24/7.   If you have an urgent issue and are unable to reach us , you may choose to seek medical care  at your doctor's office, retail clinic, urgent care center, or emergency room.  If you have a medical emergency, please immediately call 911 or go to the emergency department.  Pager Numbers  - Dr. Bary Likes: [phone]  - Dr. Annette Barters: [phone]  - Dr. Felipe Horton: [phone]   In the event of inclement weather, please call our main line at [phone] for an update on the status of any delays or closures.  Dermatology Medication Tips: Please keep the boxes that topical medications come in in order to help keep track of the instructions  about where and how to use these. Pharmacies typically print the medication instructions only on the boxes and not directly on the medication tubes.   If your medication is too expensive, please contact our office at [phone] option 4 or send us  a message through MyChart.   We are unable to tell what your co-pay for medications will be in advance as this is different depending on your insurance coverage. However, we may be able to find a substitute medication at lower cost or fill out paperwork to get insurance to cover a needed medication.   If a prior authorization is required to get your medication covered by your insurance company, please allow us  1-2 business days to complete this process.  Drug prices often vary depending on where the prescription is filled and some pharmacies may offer cheaper prices.  The website www.goodrx.com contains coupons for medications through different pharmacies. The prices here do not account for what the cost may be with help from insurance (it may be cheaper with your insurance), but the website can give you the price if you did not use any insurance.  - You can print the associated coupon and take it with your prescription to the pharmacy.  - You may also stop by our office during regular business hours and pick up a GoodRx coupon card.  - If you need your prescription sent electronically to a different pharmacy, notify our office through [facility] or by phone at [phone] option 4.     Si Usted Necesita Algo Despus de Su Visita  Tambin puede enviarnos un mensaje a travs de Clinical cytogeneticist. Por lo general respondemos a los mensajes de MyChart en el transcurso de 1 a 2 das hbiles.  Para renovar recetas, por favor pida a su farmacia que se ponga en contacto con nuestra oficina. Franz Jacks de fax es [place] [phone].  Si tiene un asunto urgente cuando la clnica est cerrada y que no puede esperar hasta el siguiente da hbil, puede  llamar/localizar a su doctor(a) al nmero que aparece a continuacin.   Por favor, tenga en cuenta que aunque hacemos todo lo posible para estar disponibles para asuntos urgentes fuera del horario de [place], no estamos disponibles las 24 horas del da, los 7 [address]  [address] de la [place].   Si tiene un problema urgente y no puede comunicarse con nosotros, puede optar por buscar atencin mdica  en el consultorio de su doctor(a), en una clnica privada, en un centro de atencin urgente o en una sala de emergencias.  Si tiene Engineer, drilling, por favor llame inmediatamente al 911 o vaya a la sala de emergencias.  Nmeros de bper  - Dr. Bary Likes: [phone]  - Dra. Annette Barters: [phone]  - Dr. Felipe Horton: [phone]   En caso de inclemencias del tiempo, por favor llame a Lajuan Pila principal al [phone] para una actualizacin sobre el [place] de cualquier retraso o cierre.  Consejos para la  medicacin en dermatologa: Por favor, guarde las cajas en las que vienen los medicamentos de uso tpico para ayudarle a seguir las instrucciones sobre dnde y cmo usarlos. Las farmacias generalmente imprimen las instrucciones del medicamento slo en las cajas y no directamente en los tubos del [place].   Si su medicamento es muy caro, por favor, pngase en contacto con Bettyjane Brunet llamando al [phone] y presione la opcin 4 o envenos un mensaje a travs de Clinical cytogeneticist.   No podemos decirle cul ser su copago por los medicamentos por adelantado ya que esto es diferente dependiendo de la cobertura de su seguro. Sin embargo, es posible que podamos encontrar un medicamento sustituto a Audiological scientist un formulario para que el seguro cubra el medicamento que se considera necesario.   Si se requiere una autorizacin previa para que su compaa de seguros [place] su medicamento, por favor permtanos de 1 a 2 das hbiles para completar este proceso.  Los precios de los medicamentos varan con  frecuencia dependiendo del Environmental consultant de dnde se surte la receta y alguna farmacias pueden ofrecer precios ms baratos.  El sitio web www.goodrx.com tiene cupones para medicamentos de Health and safety inspector. Los precios aqu no tienen en cuenta lo que podra costar con la ayuda del seguro (puede ser ms barato con su seguro), pero el sitio web puede darle el precio si no utiliz Tourist information centre manager.  - Puede imprimir el cupn correspondiente y llevarlo con su receta a la farmacia.  - Tambin puede pasar por nuestra oficina durante el horario de atencin regular y Education officer, museum una tarjeta de cupones de GoodRx.  - Si necesita que su receta se enve electrnicamente a una farmacia diferente, informe a nuestra oficina a travs de MyChart de [place] o por telfono llamando al [phone] y presione la opcin 4.

## 2024-04-20 ENCOUNTER — Ambulatory Visit: Payer: Self-pay | Admitting: Dermatology

## 2024-04-20 ENCOUNTER — Encounter: Payer: Self-pay | Admitting: Dermatology

## 2024-04-20 LAB — SURGICAL PATHOLOGY

## 2024-04-20 NOTE — Telephone Encounter (Signed)
-----   Message from Fowler sent at 04/20/2024  5:17 PM EDT ----- Diagnosis: left chest :       DYSPLASTIC COMPOUND NEVUS WITH MILD ATYPIA, MARGINS FREE    Plan: please call with result and schedule FBSE when next available if patient desires. Biopsy shows a mildly atypical mole. These are benign and do not need further treatment, but suggest you may be  at a higher risk of melanoma in the future. We recommend skin checks every 1 to 2 years. ----- Message ----- From: Interface, Lab In Three Zero One Sent: 04/20/2024   3:56 PM EDT To: Harris Liming, MD

## 2024-05-14 DIAGNOSIS — Z419 Encounter for procedure for purposes other than remedying health state, unspecified: Secondary | ICD-10-CM | POA: Diagnosis not present
